# Patient Record
Sex: Female | Born: 1980
Health system: Southern US, Community
[De-identification: ages and names within clinical notes are randomized; demographics above are authoritative.]

## PROBLEM LIST (undated history)

## (undated) DIAGNOSIS — M797 Fibromyalgia: Secondary | ICD-10-CM

## (undated) DIAGNOSIS — F319 Bipolar disorder, unspecified: Secondary | ICD-10-CM

## (undated) DIAGNOSIS — I10 Essential (primary) hypertension: Secondary | ICD-10-CM

## (undated) DIAGNOSIS — F32A Depression, unspecified: Secondary | ICD-10-CM

## (undated) DIAGNOSIS — F419 Anxiety disorder, unspecified: Secondary | ICD-10-CM

## (undated) DIAGNOSIS — D497 Neoplasm of unspecified behavior of endocrine glands and other parts of nervous system: Secondary | ICD-10-CM

## (undated) DIAGNOSIS — B80 Enterobiasis: Secondary | ICD-10-CM

## (undated) DIAGNOSIS — F329 Major depressive disorder, single episode, unspecified: Secondary | ICD-10-CM

## (undated) HISTORY — PX: ECTOPIC PREGNANCY SURGERY: SHX613

## (undated) HISTORY — PX: OTHER SURGICAL HISTORY: SHX169

## (undated) HISTORY — PX: APPENDECTOMY: SHX54

## (undated) HISTORY — PX: TUBAL LIGATION: SHX77

---

## 2003-04-12 ENCOUNTER — Other Ambulatory Visit: Admission: RE | Admit: 2003-04-12 | Discharge: 2003-04-12 | Payer: Self-pay | Admitting: Obstetrics & Gynecology

## 2003-05-14 ENCOUNTER — Emergency Department (HOSPITAL_COMMUNITY): Admission: EM | Admit: 2003-05-14 | Discharge: 2003-05-14 | Payer: Self-pay | Admitting: Internal Medicine

## 2003-05-27 ENCOUNTER — Emergency Department (HOSPITAL_COMMUNITY): Admission: EM | Admit: 2003-05-27 | Discharge: 2003-05-27 | Payer: Self-pay | Admitting: Emergency Medicine

## 2004-10-23 ENCOUNTER — Emergency Department (HOSPITAL_COMMUNITY): Admission: EM | Admit: 2004-10-23 | Discharge: 2004-10-23 | Payer: Self-pay | Admitting: Emergency Medicine

## 2007-07-04 ENCOUNTER — Emergency Department (HOSPITAL_COMMUNITY): Admission: EM | Admit: 2007-07-04 | Discharge: 2007-07-04 | Payer: Self-pay | Admitting: Emergency Medicine

## 2007-07-18 ENCOUNTER — Emergency Department (HOSPITAL_COMMUNITY): Admission: EM | Admit: 2007-07-18 | Discharge: 2007-07-18 | Payer: Self-pay | Admitting: Emergency Medicine

## 2007-07-19 ENCOUNTER — Emergency Department (HOSPITAL_COMMUNITY): Admission: EM | Admit: 2007-07-19 | Discharge: 2007-07-19 | Payer: Self-pay | Admitting: Emergency Medicine

## 2007-09-30 ENCOUNTER — Inpatient Hospital Stay (HOSPITAL_COMMUNITY): Admission: AD | Admit: 2007-09-30 | Discharge: 2007-09-30 | Payer: Self-pay | Admitting: Obstetrics & Gynecology

## 2007-10-08 ENCOUNTER — Ambulatory Visit (HOSPITAL_COMMUNITY): Admission: RE | Admit: 2007-10-08 | Discharge: 2007-10-08 | Payer: Self-pay | Admitting: Obstetrics and Gynecology

## 2007-10-08 ENCOUNTER — Encounter: Payer: Self-pay | Admitting: Obstetrics and Gynecology

## 2007-11-27 ENCOUNTER — Emergency Department (HOSPITAL_COMMUNITY): Admission: EM | Admit: 2007-11-27 | Discharge: 2007-11-27 | Payer: Self-pay | Admitting: Emergency Medicine

## 2007-12-22 ENCOUNTER — Ambulatory Visit (HOSPITAL_COMMUNITY): Admission: RE | Admit: 2007-12-22 | Discharge: 2007-12-22 | Payer: Self-pay | Admitting: Urology

## 2008-02-21 ENCOUNTER — Emergency Department (HOSPITAL_COMMUNITY): Admission: EM | Admit: 2008-02-21 | Discharge: 2008-02-21 | Payer: Self-pay | Admitting: Emergency Medicine

## 2010-12-04 NOTE — H&P (Signed)
[name], [name]               ACCOUNT NO.:  [id number]   MEDICAL RECORD NO.:  [id number]          PATIENT TYPE:  AMB   LOCATION:  DAY                           FACILITY:  [facility]   PHYSICIAN:  Tilda Burrow, M.D. DATE OF BIRTH:  1981-01-08   DATE OF ADMISSION:  DATE OF DISCHARGE:  LH                              HISTORY & PHYSICAL   ADMISSION DIAGNOSIS:  Right ectopic pregnancy.   HISTORY OF PRESENT ILLNESS:  1. Unruptured, declining continued methotrexate therapy.  2. Suboptimal response to first methotrexate dose.   HISTORY OF PRESENT ILLNESS:  This 30 year old female, gravida 5, para 1,  AB 3 is admitted at this time for treatment of right ectopic pregnancy.  [name] has been followed through our office for various GYN problems for  several years.  Most recently, she was confirmed as being pregnant in  February through Dr. Talbot Grumbling at [facility] where hemoglobin was  31, hematocrit 37, with urine pregnancy test in the 400s.  She was  referred for medical care.  She returned to our office where pregnancy  levels gradually increased.  She had good progesterone levels and  quantitative HCGs increased.  She reached on March 4 she had  progesterone level 14.4 and quantitative HCG level of 4000.  Intrauterine pregnancy could not be identified on initial ultrasound,  and due to the high quantitative HCG, she was presumed to have an  ectopic pregnancy.  The patient was counseled on September 30, 2007 and  chose to receive methotrexate therapy.  At that time, a quantitative HCG  level was 13,690.  Repeat ultrasound confirmed that there was no  intrauterine pregnancy.  She received methotrexate dose per protocol at  [facility], and four days later as per protocol.  quantitative HCG was 26,494.  On day 7, today the 17th, quantitative was  29,456 and continues to rise, however, slowly.  The patient remains pain  free.  The ectopic pregnancy is clearly visualized  on ultrasound  performed at [facility] as well as our office.  The patient is  given options of repeating the methotrexate therapy, and she declines  that offer at this time.  She desires to proceed with surgical  treatment, and after counseling over the surgery, its technique,  potential difficulties including the need for removal of the entire  right tube or additional surgeries if bleeding or complications  occurred, the patient desires to proceed with laparoscopic treatment of  right ectopic.  We have told that we will make every reasonable effort  to salvage the right tube and take photos to document condition of both  tubes.  Blood type is O+.   PAST MEDICAL HISTORY:  Benign.   SURGICAL HISTORY:  C-section in 2000 in [place].  Miscarriages in 2002,  2005, 2006.   Pupils equal, round, reactive.  Extraocular movements intact.  NECK:  Supple.  CHEST:  Clear to auscultation.  ABDOMEN:  Minimal discomfort on the right.  EXTERNAL GENITALIA:  Normal.  VAGINAL EXAM:  Normal secretions.  Uterus mid position, nontender.  Adnexa not checked at this time  based on ultrasound findings.   PLAN:  Laparoscopic removal of right ectopic pregnancy on October 08, 2007.      Tilda Burrow, M.D.  Electronically Signed     JVF/MEDQ  D:  10/07/2007  T:  10/08/2007  Job:  [id number]

## 2010-12-07 NOTE — Op Note (Signed)
[name], [name]               ACCOUNT NO.:  [id number]   MEDICAL RECORD NO.:  [id number]          PATIENT TYPE:  AMB   LOCATION:  DAY                           FACILITY:  [facility]   PHYSICIAN:  Tilda Burrow, M.D. DATE OF BIRTH:  06/19/1981   DATE OF PROCEDURE:  DATE OF DISCHARGE:                               OPERATIVE REPORT   PREOPERATIVE DIAGNOSIS:  Right ectopic pregnancy.   POSTOPERATIVE DIAGNOSES:  1. Right ectopic pregnancy.  2. Intraabdominal adhesions.  3. Pelvic adhesions.   PROCEDURE PERFORMED:  Laparoscopic right salpingectomy.   SURGEON:  Tilda Burrow, M.D.   ASSISTANT:  None.   ANESTHESIA:  General.   COMPLICATIONS:  None.   FINDINGS:  Abnormal pelvic anatomy with right fallopian tube adherent to  anterior abdominal wall near the insertion of the round ligament on the  right, hugely distended ampullary portion of right tube due to ectopic  pregnancy, normal-appearing right ovary, left tube also adherent to  anterior abdominal wall in a similar position to the opposite side  though less severely distorted.   DETAILS OF PROCEDURE:  The patient was taken to the operating room and  prepped and draped for combined abdominal/vaginal procedure with Hawkins  tenaculum attached to the cervix for uterine manipulation, Foley  catheter in place.   Infraumbilical, vertical 1-cm skin incision was made and Veress needle  introduced into the umbilicus.  Water droplet test used to confirm  satisfactory intraperitoneal location and then pneumoperitoneum achieved  without difficulty using two liters CO2.  Trocar was introduced and  inspection of the pelvis revealed an area of omental adhesions to the  anterior abdominal wall at the site of a prior cesarean section scar.  A  suprapubic trocar was placed under direct visualization just inferior to  the submental attachment.  See photos 1, 2 and 3.  The omental adhesions  were taken down after a third trocar was placed  in the right lower  quadrant and harmonic scalpel used to free up the omentum.  Attention to  the pelvis was performed as noted in photo 3.  The distended right tube  was distinctly abnormal and attached to the anterior abdominal wall in a  very atypical fashion.  The right ureter could be identified as being in  the normal position, well out of surgical area.  Decision was made to  proceed with salpingectomy.   The left side was photo documented as well which showed it to be  attached to the anterior abdominal wall just lateral to the round  ligament on the left.  The tube itself and fimbria appeared grossly  normal.   Attention was then directed to the right tube and using harmonic  scalpel, the tube was transected proximally at the corner of the uterus  and distally just beneath the fimbria and the mesosalpinx taken down  with harmonic scalpel with good hemostasis.  Specimen is photo  documented in photo 5, then left tube is visualized and mobilized  partially to allow it to drape into the pelvis in a more physiologic  position right next to the  left ovary.  Re-inspection of the right  adnexa showed no additional bleeding and a photo of the right ovary is  in the chart, photo 7.  Deflation of the abdomen followed instilling 200  mL of saline.  The suprapubic and umbilical sites were closed using 0  Vicryl at the fascia and 3-0 Vicryl subcutaneously.  The right lower  quadrant incision only required subcutaneous closure.  The patient  tolerated the procedure well and went to the recovery room in stable  condition.  She received Darvocet for pain as per patient request.      Tilda Burrow, M.D.  Electronically Signed     JVF/MEDQ  D:  10/09/2007  T:  10/09/2007  Job:  [id number]   cc:   [facility]

## 2011-04-19 LAB — BASIC METABOLIC PANEL
BUN: 11
Glucose, Bld: 162 — ABNORMAL HIGH
Potassium: 3.5

## 2011-04-19 LAB — DIFFERENTIAL
Eosinophils Absolute: 0.1
Eosinophils Relative: 1
Neutro Abs: 7.5
Neutrophils Relative %: 77

## 2011-04-19 LAB — CBC
Hemoglobin: 13.5
MCV: 93.4
Platelets: 267
RBC: 4.23
RDW: 12.2

## 2011-04-19 LAB — URINALYSIS, ROUTINE W REFLEX MICROSCOPIC
Bilirubin Urine: NEGATIVE
Glucose, UA: NEGATIVE
Leukocytes, UA: NEGATIVE
Nitrite: NEGATIVE
pH: 7

## 2011-04-19 LAB — PREGNANCY, URINE: Preg Test, Ur: NEGATIVE

## 2011-04-19 LAB — URINE MICROSCOPIC-ADD ON

## 2011-04-26 LAB — URINE CULTURE: Colony Count: >=100000

## 2011-04-26 LAB — URINE MICROSCOPIC-ADD ON

## 2011-04-26 LAB — URINALYSIS, ROUTINE W REFLEX MICROSCOPIC
Protein, ur: 100 — AB
pH: 5.5

## 2011-04-29 LAB — URINE MICROSCOPIC-ADD ON

## 2011-04-29 LAB — URINALYSIS, ROUTINE W REFLEX MICROSCOPIC
Ketones, ur: NEGATIVE
Nitrite: NEGATIVE

## 2011-04-29 LAB — PREGNANCY, URINE: Preg Test, Ur: NEGATIVE

## 2012-02-14 ENCOUNTER — Encounter (HOSPITAL_COMMUNITY): Payer: Self-pay | Admitting: Emergency Medicine

## 2012-02-14 ENCOUNTER — Emergency Department (HOSPITAL_COMMUNITY)
Admission: EM | Admit: 2012-02-14 | Discharge: 2012-02-14 | Disposition: A | Discharge status: Home / Self Care | Payer: Medicaid Other | Attending: Emergency Medicine | Admitting: Emergency Medicine

## 2012-02-14 DIAGNOSIS — R600 Localized edema: Secondary | ICD-10-CM

## 2012-02-14 DIAGNOSIS — IMO0002 Reserved for concepts with insufficient information to code with codable children: Secondary | ICD-10-CM

## 2012-02-14 DIAGNOSIS — L03039 Cellulitis of unspecified toe: Secondary | ICD-10-CM | POA: Insufficient documentation

## 2012-02-14 DIAGNOSIS — Z9089 Acquired absence of other organs: Secondary | ICD-10-CM | POA: Insufficient documentation

## 2012-02-14 MED ORDER — ACETAMINOPHEN 325 MG PO TABS
650.0000 mg | ORAL_TABLET | Freq: Once | ORAL | Status: AC
  Administered 2012-02-14: 650 mg via ORAL
  Filled 2012-02-14: qty 2

## 2012-02-14 MED ORDER — CEPHALEXIN 500 MG PO CAPS: 500.0000 mg | ORAL_CAPSULE | Freq: Four times a day (QID) | ORAL | Status: AC

## 2012-02-14 MED ORDER — CEPHALEXIN 500 MG PO CAPS
500.0000 mg | ORAL_CAPSULE | Freq: Once | ORAL | Status: AC
  Administered 2012-02-14: 500 mg via ORAL
  Filled 2012-02-14: qty 1

## 2012-02-14 NOTE — ED Notes (Addendum)
Pt states she has had some leg and foot swelling x1 week. States the pain and swelling is happening bilaterally. Also notes some bleeding from her R great toe starting yesterday. Denies injury to the toe. Right foot appears more swollen than left. Notes starting a new medication suboxone for 2 months.

## 2012-02-14 NOTE — ED Provider Notes (Signed)
History     CSN: [id number]  Arrival date & time 02/14/12  0247   First MD Initiated Contact with Patient 02/14/12 0310      Chief Complaint  Patient presents with  . Foot Swelling    (Consider location/radiation/quality/duration/timing/severity/associated sxs/prior treatment) HPI [name] is a 31 y.o. female who presents to the Emergency Department complaining of bilateral foot swelling for two days. She works on her feet, standing on hard floors. She noticed over the last two days, swelling to both feet when she gets home in the morning. Now with discomfort to  the feet that extends up to her knees. Denies numbness, tingling, extremity weakness.  PCP Dr. Felecia Shelling    History reviewed. No pertinent past medical history.  Past Surgical History  Procedure Date  . Cesarean section     x2  . Appendectomy     History reviewed. No pertinent family history.  History  Substance Use Topics  . Smoking status: Never Smoker   . Smokeless tobacco: Not on file  . Alcohol Use: No    OB History    Grav Para Term Preterm Abortions TAB SAB Ect Mult Living                  Review of Systems  Constitutional: Negative for fever.       10 Systems reviewed and are negative for acute change except as noted in the HPI.  HENT: Negative for congestion.   Eyes: Negative for discharge and redness.  Respiratory: Negative for cough and shortness of breath.   Cardiovascular: Negative for chest pain.  Gastrointestinal: Negative for vomiting and abdominal pain.  Musculoskeletal: Negative for back pain.       Foot swelling  Skin: Negative for rash.  Neurological: Negative for syncope, numbness and headaches.  Psychiatric/Behavioral:       No behavior change.    Allergies  Review of patient's allergies indicates no known allergies.  Home Medications   Current Outpatient Rx  Name Route Sig Dispense Refill  . ALPRAZOLAM 1 MG PO TABS Oral Take 1 mg by mouth at bedtime as needed.     . AMPHETAMINE-DEXTROAMPHETAMINE 20 MG PO TABS Oral Take 20 mg by mouth 2 (two) times daily.    Marland Kitchen BUPRENORPHINE HCL-NALOXONE HCL 8-2 MG SL SUBL Sublingual Place under the tongue 2 (two) times daily.    . CEPHALEXIN 500 MG PO CAPS Oral Take 1 capsule (500 mg total) by mouth 4 (four) times daily. 40 capsule 0    BP 123/72  Pulse 118  Temp 98.4 F (36.9 C)  Resp 18  Ht 5\' 2"  (1.575 m)  Wt 125 lb (56.7 kg)  BMI 22.86 kg/m2  SpO2 99%  LMP 01/21/2012  Physical Exam  Nursing note and vitals reviewed. Constitutional: She is oriented to person, place, and time. She appears well-developed and well-nourished.       Awake, alert, nontoxic appearance.  HENT:  Head: Atraumatic.  Right Ear: External ear normal.  Left Ear: External ear normal.  Eyes: Conjunctivae and EOM are normal. Pupils are equal, round, and reactive to light. Right eye exhibits no discharge. Left eye exhibits no discharge.  Neck: Normal range of motion. Neck supple.  Cardiovascular: Normal rate, normal heart sounds and intact distal pulses.   Pulmonary/Chest: Effort normal and breath sounds normal. She exhibits no tenderness.  Abdominal: Soft. There is no tenderness. There is no rebound.  Musculoskeletal: She exhibits no tenderness.  Baseline ROM, no obvious new focal weakness. Trace edema to dorsum of feet. Right great toe with paronychia.  Neurological: She is alert and oriented to person, place, and time.       Mental status and motor strength appears baseline for patient and situation.  Skin: No rash noted.  Psychiatric: She has a normal mood and affect.    ED Course  Procedures (including critical care time)      MDM  Patient with bilateral foot swelling. Also has a paronychia on the right great toe. Given antibiotics and  Tylenol. Patient did not want toe opened and drained. Recommended I&D and alternative with soaks and antibiotics. Patient knows to return if toe worsens. Pt stable in ED with no  significant deterioration in condition.The patient appears reasonably screened and/or stabilized for discharge and I doubt any other medical condition or other [facility] requiring further screening, evaluation, or treatment in the ED at this time prior to discharge.  MDM Reviewed: previous chart, nursing note and vitals Interpretation: labs and ECG          Nicoletta Dress. Colon Branch, MD 02/14/12 [phone]

## 2012-03-08 ENCOUNTER — Emergency Department (HOSPITAL_COMMUNITY)
Admission: EM | Admit: 2012-03-08 | Discharge: 2012-03-10 | Disposition: A | Discharge status: Home / Self Care | Payer: Medicaid Other | Attending: Emergency Medicine | Admitting: Emergency Medicine

## 2012-03-08 ENCOUNTER — Encounter (HOSPITAL_COMMUNITY): Payer: Self-pay | Admitting: Emergency Medicine

## 2012-03-08 DIAGNOSIS — F19959 Other psychoactive substance use, unspecified with psychoactive substance-induced psychotic disorder, unspecified: Secondary | ICD-10-CM

## 2012-03-08 DIAGNOSIS — R21 Rash and other nonspecific skin eruption: Secondary | ICD-10-CM | POA: Insufficient documentation

## 2012-03-08 DIAGNOSIS — Z9089 Acquired absence of other organs: Secondary | ICD-10-CM | POA: Insufficient documentation

## 2012-03-08 DIAGNOSIS — F1999 Other psychoactive substance use, unspecified with unspecified psychoactive substance-induced disorder: Secondary | ICD-10-CM | POA: Insufficient documentation

## 2012-03-08 LAB — ETHANOL: Alcohol, Ethyl (B): <11 mg/dL (ref 0–11)

## 2012-03-08 LAB — URINALYSIS, ROUTINE W REFLEX MICROSCOPIC
Leukocytes, UA: NEGATIVE
Nitrite: NEGATIVE
Specific Gravity, Urine: >1.03 — ABNORMAL HIGH (ref 1.005–1.030)
pH: 5.5 (ref 5.0–8.0)

## 2012-03-08 LAB — POCT I-STAT, CHEM 8
BUN: 18 mg/dL (ref 6–23)
Creatinine, Ser: 1 mg/dL (ref 0.50–1.10)
Potassium: 2.9 mEq/L — ABNORMAL LOW (ref 3.5–5.1)
Sodium: 140 mEq/L (ref 135–145)
TCO2: 28 mmol/L (ref 0–100)

## 2012-03-08 LAB — CBC
Hemoglobin: 13.2 g/dL (ref 12.0–15.0)
MCHC: 34.8 g/dL (ref 30.0–36.0)

## 2012-03-08 LAB — POTASSIUM: Potassium: 4 mEq/L (ref 3.5–5.1)

## 2012-03-08 LAB — RAPID URINE DRUG SCREEN, HOSP PERFORMED: Benzodiazepines: POSITIVE — AB

## 2012-03-08 MED ORDER — LORAZEPAM 1 MG PO TABS: 2.0000 mg | ORAL_TABLET | Freq: Two times a day (BID) | ORAL | Status: DC | PRN

## 2012-03-08 MED ORDER — ZIPRASIDONE HCL 20 MG PO CAPS
20.0000 mg | ORAL_CAPSULE | Freq: Two times a day (BID) | ORAL | Status: DC
  Administered 2012-03-08: 20 mg via ORAL
  Filled 2012-03-08 (×5): qty 1

## 2012-03-08 MED ORDER — POTASSIUM CHLORIDE CRYS ER 20 MEQ PO TBCR
40.0000 meq | EXTENDED_RELEASE_TABLET | Freq: Two times a day (BID) | ORAL | Status: DC
  Administered 2012-03-08 – 2012-03-10 (×4): 40 meq via ORAL
  Filled 2012-03-08 (×3): qty 2

## 2012-03-08 MED ORDER — POTASSIUM CHLORIDE CRYS ER 20 MEQ PO TBCR
40.0000 meq | EXTENDED_RELEASE_TABLET | Freq: Once | ORAL | Status: AC
  Administered 2012-03-08: 40 meq via ORAL
  Filled 2012-03-08: qty 2

## 2012-03-08 MED ORDER — CLONIDINE HCL 0.1 MG PO TABS: 0.1000 mg | ORAL_TABLET | Freq: Three times a day (TID) | ORAL | Status: DC | PRN

## 2012-03-08 NOTE — ED Notes (Signed)
Pt reporting generalized rash, and "feeling like there are pins in my arms, and something trying to come out of my skin."  Isolated small red, scabbed areas noted on pt's arms and left cheek.  Pt reporting scratching areas due to itching.  No distress noted at this time.

## 2012-03-08 NOTE — ED Notes (Signed)
Patient c/o rash and feeling like she's being bitten and that she has pins coming out of her arms.

## 2012-03-08 NOTE — ED Provider Notes (Cosign Needed)
Pt has been sleeping most of day.  When awakened she still feels like she has bugs on her legs. Has some faint redness of her lower legs c/w scratching. Otherwise she has no complaints and is very cooperative.    10:22 Samara Deist ACT states she was aware of patient but was told there was no rush to see patient.   12:45 Samara Deist ACT has seen patient and submitted papers to [facility], waiting for psychosis bed.   Ward Givens, MD 03/08/12 1246

## 2012-03-08 NOTE — ED Notes (Signed)
Rodney Booze (sister)  [phone] Ceasar Mons (aunt) [phone] or [phone] Rhunette Croft (grandmother) [phone]

## 2012-03-08 NOTE — ED Provider Notes (Signed)
History     CSN: [id number]  Arrival date & time 03/08/12  0145   First MD Initiated Contact with Patient 03/08/12 [phone]      Chief Complaint  Patient presents with  . Rash    (Consider location/radiation/quality/duration/timing/severity/associated sxs/prior treatment) HPI HX provided by PT - believes there are bugs everywhere and they are biting her and her 22 month old child.  She has ben bathing herself and her child repeatedly without any change.  She has had multiple clinic and ED visits for this and treated for scabies, pinworm, skin infections without any relief of symptoms. No F/C, no N/V, no recent travel or known exposures.   Further history provided by sister and aunt who accompany her to the ED. Has previous h/o drug use "anything and everything" - recently evaluated by a PCP who prescribed suboxone, adderall and xanax.  Symptoms seem to be related to starting these medications.  Family are concerned for well-being of her child as she has been seen scrubbing the child with alcohol and giving multiple baths and constantly picking at her skin and her child with perseverating over bugs that no one else can see. Sister is comfortable caring for child if PT requires admission.   History reviewed. No pertinent past medical history.  Past Surgical History  Procedure Date  . Cesarean section     x2  . Appendectomy     No family history on file.  History  Substance Use Topics  . Smoking status: Never Smoker   . Smokeless tobacco: Not on file  . Alcohol Use: No    OB History    Grav Para Term Preterm Abortions TAB SAB Ect Mult Living                  Review of Systems  Constitutional: Negative for fever and chills.  HENT: Negative for neck pain and neck stiffness.   Eyes: Negative for pain.  Respiratory: Negative for shortness of breath.   Cardiovascular: Negative for chest pain.  Gastrointestinal: Negative for abdominal pain.  Genitourinary: Negative for dysuria.    Musculoskeletal: Negative for back pain.  Neurological: Negative for headaches.  All other systems reviewed and are negative.    Allergies  Review of patient's allergies indicates no known allergies.  Home Medications   Current Outpatient Rx  Name Route Sig Dispense Refill  . ALPRAZOLAM 1 MG PO TABS Oral Take 1 mg by mouth at bedtime as needed.    . AMPHETAMINE-DEXTROAMPHETAMINE 20 MG PO TABS Oral Take 20 mg by mouth 2 (two) times daily.    Marland Kitchen BUPRENORPHINE HCL-NALOXONE HCL 8-2 MG SL SUBL Sublingual Place under the tongue 2 (two) times daily.      BP 128/89  Pulse 115  Temp 98.4 F (36.9 C) (Oral)  Resp 18  SpO2 98%  LMP 01/21/2012  Physical Exam  Constitutional: She is oriented to person, place, and time. She appears well-developed and well-nourished.  HENT:  Head: Normocephalic and atraumatic.  Eyes: Conjunctivae and EOM are normal. Pupils are equal, round, and reactive to light.  Neck: Trachea normal. Neck supple. No thyromegaly present.  Cardiovascular: Normal rate, regular rhythm, S1 normal, S2 normal and normal pulses.     No systolic murmur is present   No diastolic murmur is present  Pulses:      Radial pulses are 2+ on the right side, and 2+ on the left side.  Pulmonary/Chest: Effort normal and breath sounds normal. She has no wheezes. She  has no rhonchi. She has no rales. She exhibits no tenderness.  Abdominal: Soft. Normal appearance and bowel sounds are normal. There is no tenderness. There is no CVA tenderness and negative Murphy's sign.  Musculoskeletal:       BLE:s Calves nontender, no cords or erythema, negative Homans sign  Neurological: She is alert and oriented to person, place, and time. She has normal strength. No cranial nerve deficit or sensory deficit. GCS eye subscore is 4. GCS verbal subscore is 5. GCS motor subscore is 6.  Skin: Skin is warm and dry. She is not diaphoretic.       No sig lesions or wounds.  There are few small areas of erythema  but no cellulitis, abscess or sig excoriations  Psychiatric: Her speech is normal.       Psychosis present    ED Course  Procedures (including critical care time)  Results for orders placed during the hospital encounter of 03/08/12  CBC      Component Value Range   WBC 4.6  4.0 - 10.5 K/uL   RBC 4.38  3.87 - 5.11 MIL/uL   Hemoglobin 13.2  12.0 - 15.0 g/dL   HCT 96.0  45.4 - 09.8 %   MCV 86.5  78.0 - 100.0 fL   MCH 30.1  26.0 - 34.0 pg   MCHC 34.8  30.0 - 36.0 g/dL   RDW 11.9  14.7 - 82.9 %   Platelets 217  150 - 400 K/uL  ETHANOL      Component Value Range   Alcohol, Ethyl (B) <11  0 - 11 mg/dL  URINALYSIS, ROUTINE W REFLEX MICROSCOPIC      Component Value Range   Color, Urine YELLOW  YELLOW   APPearance CLOUDY (*) CLEAR   Specific Gravity, Urine >1.030 (*) 1.005 - 1.030   pH 5.5  5.0 - 8.0   Glucose, UA NEGATIVE  NEGATIVE mg/dL   Hgb urine dipstick NEGATIVE  NEGATIVE   Bilirubin Urine SMALL (*) NEGATIVE   Ketones, ur TRACE (*) NEGATIVE mg/dL   Protein, ur NEGATIVE  NEGATIVE mg/dL   Urobilinogen, UA 1.0  0.0 - 1.0 mg/dL   Nitrite NEGATIVE  NEGATIVE   Leukocytes, UA NEGATIVE  NEGATIVE  URINE RAPID DRUG SCREEN (HOSP PERFORMED)      Component Value Range   Opiates NONE DETECTED  NONE DETECTED   Cocaine NONE DETECTED  NONE DETECTED   Benzodiazepines POSITIVE (*) NONE DETECTED   Amphetamines POSITIVE (*) NONE DETECTED   Tetrahydrocannabinol NONE DETECTED  NONE DETECTED   Barbiturates NONE DETECTED  NONE DETECTED  POCT I-STAT, CHEM 8      Component Value Range   Sodium 140  135 - 145 mEq/L   Potassium 2.9 (*) 3.5 - 5.1 mEq/L   Chloride 101  96 - 112 mEq/L   BUN 18  6 - 23 mg/dL   Creatinine, Ser 5.62  0.50 - 1.10 mg/dL   Glucose, Bld 91  70 - 99 mg/dL   Calcium, Ion 1.30  8.65 - 1.23 mmol/L   TCO2 28  0 - 100 mmol/L   Hemoglobin 13.6  12.0 - 15.0 g/dL   HCT 78.4  69.6 - 29.5 %   6:19 AM telepsych consult obtained, Dr Trisha Mangle recommends inpatient PSY admit, is NOT  psychaitricaly stable, stop all current meds and start ativan 2mg  BID, Geodon 20mg  BID, Clonidine 0.1mg  PO TID prn opiod withdrawal symptoms. No SI/ HI.  Social worker consult to notify CPS, sister and  aunt currently have 95 mo old child.    ACT notified and will help arrange PSY admit. IVC  6:43 AM d/w [facility] - no 400 beds available.   ED holding orders and plan PSY admit.  MDM   nurisng notes and VS reviewed. Labs as above. PSY consult and PSY dispo plan admit        Sunnie Nielsen, MD 03/08/12 [phone]

## 2012-03-08 NOTE — ED Notes (Signed)
Notified Dr. Lynelle Doctor that ACT member related [facility] will not accept patient until K+ is corrected to at least 3.5.  Called Samara Deist w/ACT to find out if [facility] actually has a bed available now, which will determine if K+ is treated IV or PO.

## 2012-03-08 NOTE — ED Notes (Signed)
Social work called, on the way to see pt.

## 2012-03-08 NOTE — ED Notes (Signed)
Telepsych consult begun.

## 2012-03-08 NOTE — ED Notes (Addendum)
Pt observed repeatedly wiping down room.  When asked, she states she just" wanted to make sure it's clean."  Pt presently waiting for telepsych consult. However, she is third on the list for consult.

## 2012-03-08 NOTE — BH Assessment (Signed)
Assessment Note   [name] is an 31 y.o. female. PT WAS BROUGHT IN BY FAMILY DUE TO BIZARRE BEHAVIOR & POSSIBLE DELUSIONAL SYMPTOMS. PT BELIEVES BUGS & THINGS ARE CRAWLING OVER HER & BABY. PT HAS BEEN GIVING HER 13 MONTH OLD BABY SEVERAL BATH IN A DAY INCLUDING RUBBING HER DOWN WITH ALCOHOL WIPES WHICH HAS CAUSE RASHES ON THE BABY. PT SAYS SHE SEES THIS THINGS ON HER LEG & BODY EVEN THOUGH THERE IS NOTHINGS VISIBLY CRAWLING ON PT. PT DENIES ANY IDEATION BUT ADMITS TO A PAST HX OF PAIN PILL ABUSE WHICH SHE IS ONE SUBOXEN FOR. PT DENIES ANY CURRENT DRUG USE. PT HAS BEEN TELEPSYCHED & THEY RECOMMENDED INPT TX. PT HAS BEEN REFERRED TO [facility].  Axis I: DELUSIONAL DISORDER Axis II: Deferred Axis III: History reviewed. No pertinent past medical history. Axis IV: other psychosocial or environmental problems Axis V: 21-30 behavior considerably influenced by delusions or hallucinations OR serious impairment in judgment, communication OR inability to function in almost all areas  Past Medical History: History reviewed. No pertinent past medical history.  Past Surgical History  Procedure Date  . Cesarean section     x2  . Appendectomy     Family History: No family history on file.  Social History:  reports that she has never smoked. She does not have any smokeless tobacco history on file. She reports that she does not drink alcohol or use illicit drugs.  Additional Social History:     CIWA: CIWA-Ar BP: 98/59 mmHg Pulse Rate: 82  COWS:    Allergies: No Known Allergies  Home Medications:  (Not in a hospital admission)  OB/GYN Status:  Patient's last menstrual period was 01/21/2012.  General Assessment Data Location of Assessment: [facility] ACT Assessment: Yes Living Arrangements: Parent;Other relatives;Children Can pt return to current living arrangement?: Yes Admission Status: Involuntary Is patient capable of signing voluntary admission?: Yes Transfer from:  Acute Hospital Referral Source: MD     Risk to self Suicidal Ideation: No Suicidal Intent: No Is patient at risk for suicide?: No Suicidal Plan?: No Access to Means: No What has been your use of drugs/alcohol within the last 12 months?: PT HAS A HX OF ABUSING PAIN PILLS Previous Attempts/Gestures: No How many times?: 0  Other Self Harm Risks: HX OF SELF MEDICATING Triggers for Past Attempts: Unpredictable Intentional Self Injurious Behavior: None Family Suicide History: Yes Recent stressful life event(s): Turmoil (Comment) Persecutory voices/beliefs?: Yes Depression: No Depression Symptoms: Loss of interest in usual pleasures Substance abuse history and/or treatment for substance abuse?: Yes Suicide prevention information given to non-admitted patients: Not applicable  Risk to Others Homicidal Ideation: No Thoughts of Harm to Others: No Current Homicidal Intent: No Current Homicidal Plan: No Access to Homicidal Means: No Identified Victim: NA History of harm to others?: No Assessment of Violence: None Noted Violent Behavior Description: CALM, COOPERATIVE Does patient have access to weapons?: No Criminal Charges Pending?: No Does patient have a court date: No  Psychosis Hallucinations: None noted Delusions: None noted  Mental Status Report Appear/Hygiene: Disheveled Eye Contact: Fair Motor Activity: Freedom of movement Speech: Logical/coherent Level of Consciousness: Alert Mood: Anhedonia;Suspicious Affect: Appropriate to circumstance Anxiety Level: None Thought Processes: Coherent;Relevant Judgement: Unimpaired Orientation: Person;Place;Time;Situation Obsessive Compulsive Thoughts/Behaviors: None  Cognitive Functioning Concentration: Decreased Memory: Recent Intact;Remote Intact IQ: Average Insight: Poor Impulse Control: Poor Appetite: Fair Weight Loss: 0  Weight Gain: 0  Sleep: Decreased Total Hours of Sleep: 0  Vegetative Symptoms:  None  ADLScreening [facility]) Patient's cognitive ability adequate to safely complete daily activities?: Yes Patient able to express need for assistance with ADLs?: Yes Independently performs ADLs?: Yes (appropriate for developmental age)  Abuse/Neglect [facility]) Physical Abuse: Denies Verbal Abuse: Denies Sexual Abuse: Denies  Prior Inpatient Therapy Prior Inpatient Therapy: No Prior Therapy Dates: NA Prior Therapy Facilty/Provider(s): NA Reason for Treatment: NA  Prior Outpatient Therapy Prior Outpatient Therapy: Yes Prior Therapy Dates: CURRENT Prior Therapy Facilty/Provider(s): DR. [facility] Reason for Treatment: MED MANAGEMENT  ADL Screening (condition at time of admission) Patient's cognitive ability adequate to safely complete daily activities?: Yes Patient able to express need for assistance with ADLs?: Yes Independently performs ADLs?: Yes (appropriate for developmental age)       Abuse/Neglect Assessment (Assessment to be complete while patient is alone) Physical Abuse: Denies Verbal Abuse: Denies Sexual Abuse: Denies Values / Beliefs Cultural Requests During Hospitalization: None Spiritual Requests During Hospitalization: None        Additional Information 1:1 In Past 12 Months?: No CIRT Risk: No Elopement Risk: No Does patient have medical clearance?: Yes     Disposition:  Disposition Disposition of Patient: Inpatient treatment program;Referred to ([facility]) Type of inpatient treatment program: Adult  On Site Evaluation by:   Reviewed with Physician:     Waldron Session 03/08/2012 11:28 AM

## 2012-03-08 NOTE — ED Notes (Signed)
Dr. Dierdre Highman to discuss recommendations of psychiatrist with pt.  It is recommended that pt be admitted as inpatient psychiatric pt.  Pt is still voluntary at this time, however may be under IVC if necessary.  Dr Dierdre Highman also recommends that Child Protective Services be notified that while pt's child is safe at this time and staying with family so no immediate concern for safety is present, his mother is not in stable enough condition to adequately care for him.  Social work consult ordered to inform CPS.

## 2012-03-08 NOTE — ED Notes (Signed)
[place] CPS report made.  CPS will follow up on 8/19, since pt is in ED and up for tx to a [facility] facility.  CPS also informed that, per pt, her child is in physical custody of her sister, Monico Hoar who can be reached at [phone].

## 2012-03-08 NOTE — ED Notes (Signed)
Telespych consult completed.

## 2012-03-09 MED ORDER — RISPERIDONE 0.5 MG PO TABS
0.5000 mg | ORAL_TABLET | Freq: Two times a day (BID) | ORAL | Status: DC
  Administered 2012-03-09 – 2012-03-10 (×3): 0.5 mg via ORAL
  Filled 2012-03-09 (×7): qty 1

## 2012-03-09 MED ORDER — LORAZEPAM 1 MG PO TABS
1.0000 mg | ORAL_TABLET | Freq: Once | ORAL | Status: AC
  Administered 2012-03-09: 1 mg via ORAL
  Filled 2012-03-09: qty 1

## 2012-03-09 NOTE — ED Notes (Signed)
Patient's belongings put in safekeeping in EMS room.  Patient was given crackers, peanut butter, and Coke.

## 2012-03-09 NOTE — ED Notes (Signed)
Pt needed note faxed to courthouse proving she is here. Sent to number provided. Pt aware. Pt remains cooperative.

## 2012-03-09 NOTE — BHH Counselor (Signed)
Received  A call from Fannie Knee in Assessments at [facility]. She informed Clinical research associate that because the patient is followed for Suboxone she cannot be admitted to [facility]. Writer contacted [facility] in order to refer the patient. No answer was received, left message for HR staff to contact this Clinical research associate. Also called [facility] to check status of the referral, left message with Joni Reining, she is to return our call.

## 2012-03-09 NOTE — ED Notes (Signed)
Pt back from shower. Nad. Comb given also. Inpatient bed given for better comfort.

## 2012-03-09 NOTE — ED Provider Notes (Signed)
This chart was scribed for Devoria Albe, MD by Charolett Bumpers . The patient was seen in room APA14/APA14.   Tommy, ACT has seen patient and thought she could be discharged and see her psychiatrist in 2 days.   12:14-Recheck: Pt reports that she no longer feels bugs crawling on her. Pt reports that she lives at home with mother-in-law and reports some bug bites on her son. Pt still seems to believe they had bug bites on herself and her sone.  Pt is up walking around room and reports she is feeling better. Redness on legs has improved. Pt is awaiting telepsych.  Nurses report she c/o being sleepy on her medications (geodon and ativan) and she wasn't given it last night.   Nurses report sister brought baby to visit patient and he had abrasions on his face from the patient scrubbing him.   Telepsych consult has been done by dr Leretha Pol. We had discussed her sleepiness on the current medications and her persistant delusions about the bugs. She recommends stopping the geodon, decrease the ativan to 0.5 mg BID and start risperidone 0.5 mg BID. She feels she still needs inpatient psychiatric admission. She recommends reporting to CPS which was done yesterday.   I personally performed the services described in this documentation, which was scribed in my presence. The recorded information has been reviewed and considered.  Devoria Albe, MD, Armando Gang   Ward Givens, MD 03/09/12 [phone]

## 2012-03-09 NOTE — BHH Counselor (Signed)
Spoke with Albin Felling in assessments at [facility]. She informed us that there would be no beds today. She asked that we call at 9:00 am tomorrow to check on discharges. Also called [facility]. Informed that they were just going through referrals and that someone would contact the hospital later. Patient will remain in the ED overnight unlesss she is accepted to [facility]. Patient status given to Dr Adriana Simas.

## 2012-03-09 NOTE — ED Notes (Signed)
Patient given crackers and a soda for a snack

## 2012-03-09 NOTE — ED Notes (Signed)
Pt visiting with sister and patient son at this time. Waiting on Tele pysh re-evaluation

## 2012-03-09 NOTE — ED Notes (Signed)
Patient with IVC paperwork since yesterday. Patient has not had sitter sitting with her. Notified charge nurse, Steward Drone, RN. Sitter now placed with patient at this time.

## 2012-03-09 NOTE — ED Notes (Signed)
This shift pt has not had any abnormal psychiatric behaviors; No itching report nor scratching observed; Pt has been calm and cooperative.

## 2012-03-09 NOTE — ED Notes (Signed)
Tele psychiatrist re-evaluated and still recommends in patient hospitalization for patient.

## 2012-03-09 NOTE — BHH Counselor (Signed)
Talked with the patient in her room. She denies any bugs etc. Crawling on her or her child today. She says she has only had these thoughts when she is alone at the home she is living in. She does not like being there. She and her son are living in the basement of the home and she feels very closed in. She has been seeing Carroll Sage MD.  For her medications . She is who started her on the Suboxone. Patient feels that her medications are not beneficial at this time. She has an appointment to see her psychiatrist on 03/11/12. She plans to keep this appointment and discuss her medications at that time. Talked with Dr Devoria Albe about the positive changes in the patient. Dr Lynelle Doctor will order another tele-psych to evaluate the patient  To see if inpatient is still recommended or if she can be seen outpatient.

## 2012-03-09 NOTE — Clinical Social Work Note (Signed)
Pt to be evaluated by CPS today. CSW left voicemail for Stanton Kidney at CPS to please notify this CSW if follow up is needed while pt is in ED. Child is in care of pt's sister. Plan is for pt to transfer to inpatient treatment.    Derenda Fennel, [place] [phone]

## 2012-03-10 MED ORDER — POTASSIUM CHLORIDE CRYS ER 20 MEQ PO TBCR
EXTENDED_RELEASE_TABLET | ORAL | Status: AC
  Filled 2012-03-10: qty 2

## 2012-03-10 NOTE — ED Notes (Signed)
[name] from act to see pt.

## 2012-03-10 NOTE — ED Notes (Signed)
Pt resting in bed, sitter at bedside

## 2012-03-10 NOTE — ED Provider Notes (Addendum)
[name] has seen patient again and feels that she is appropriate for discharge.  Psychiatry saw her yesterday and disagreed. Patient has no hallucinations, SI, HI.   Psychiatry consult performed again.  Calm and cooperative, no delusional thought process. IVC papers rescinded.    CPS has been involved.  Read Drivers and [place] LSW have cleared patient to go home with sister per [place].  BP 93/53  Pulse 68  Temp 97.7 F (36.5 C) (Oral)  Resp 18  SpO2 100%  LMP 01/21/2012   Glynn Octave, MD 03/10/12 1345  Glynn Octave, MD 03/10/12 [phone]

## 2012-03-10 NOTE — ED Notes (Signed)
Report given to T. Aubery Lapping, RN

## 2012-03-10 NOTE — ED Notes (Signed)
Patient placed in paper scrubs and belongings secured in ems closet. Patient verbalized understanding as to why we needed to place her in scrubs and secure her belongings. Patient compliant and cooperative.

## 2012-03-10 NOTE — ED Notes (Signed)
2 belongings bags returned to pt. Sister pick pt up.

## 2012-03-10 NOTE — ED Notes (Signed)
Pt requested staff fax info. To court house/attourney, stated that she missed her court date on Monday. Advised that was are unable to fax any info. Pt verbalized understanding.

## 2012-03-10 NOTE — ED Notes (Signed)
Pt completed tele-psych consultation, plan to discharge pt to home, dr.rancour notified, pt's belongings returned.

## 2012-03-10 NOTE — BH Assessment (Signed)
Assessment Note   [name] is an 31 y.o. female. Patient came to the Ed with thoughts that bugs were on her and her son. She had been bathing herself and her son multiple times daily. Patient was seen for tele-psych twice. It was felt that this was the result of her dosage of Adderal. Patient is now aware that she was not being bitten by bugs. She understands that this was not real. She was interviewed by CPS and will be followed by them. Patient is neither suicidal nor homicidal. She is no longer delusional    nor hallucinated. Patient no longer meets criteria for inpatient care.     Axis I: Mood DO NOS; Delusional DO(clearing-may be result of Adderal) History of Opoid Abuse Axis II: Deferred Axis III: History reviewed. No pertinent past medical history. Axis IV: economic problems, housing problems, other psychosocial or environmental problems, problems with access to health care services and problems with primary support group Axis V: 51-60 moderate symptoms  Past Medical History: History reviewed. No pertinent past medical history.  Past Surgical History  Procedure Date  . Cesarean section     x2  . Appendectomy     Family History: No family history on file.  Social History:  reports that she has never smoked. She does not have any smokeless tobacco history on file. She reports that she does not drink alcohol or use illicit drugs.  Additional Social History:     CIWA: CIWA-Ar BP: 93/53 mmHg Pulse Rate: 68  COWS:    Allergies: No Known Allergies  Home Medications:  (Not in a hospital admission)  OB/GYN Status:  Patient's last menstrual period was 01/21/2012.  General Assessment Data Location of Assessment: [facility] ACT Assessment: Yes Living Arrangements:  (moving in with sister today) Can pt return to current living arrangement?: Yes Admission Status: Voluntary Is patient capable of signing voluntary admission?: Yes Transfer from: Acute Hospital Referral Source:  MD  Education Status Is patient currently in school?: No  Risk to self Suicidal Ideation: No Suicidal Intent: No Is patient at risk for suicide?: No Suicidal Plan?: No Access to Means: No What has been your use of drugs/alcohol within the last 12 months?: hx of abusing pain pills Previous Attempts/Gestures: No How many times?: 0  Other Self Harm Risks: HX OF SELF MEDICATING Triggers for Past Attempts: None known Intentional Self Injurious Behavior: None Family Suicide History: Yes Recent stressful life event(s): Turmoil (Comment) Persecutory voices/beliefs?: No Depression: No Depression Symptoms: Loss of interest in usual pleasures Substance abuse history and/or treatment for substance abuse?: No Suicide prevention information given to non-admitted patients: Not applicable  Risk to Others Homicidal Ideation: No Thoughts of Harm to Others: No Current Homicidal Intent: No Current Homicidal Plan: No Access to Homicidal Means: No Identified Victim: NA History of harm to others?: No Assessment of Violence: None Noted Violent Behavior Description: CALM, COOPERATIVE Does patient have access to weapons?: No Criminal Charges Pending?: No Does patient have a court date: No  Psychosis Hallucinations: Tactile;Visual (was seeing and feeling bugs on her -cleared at this time) Delusions: None noted  Mental Status Report Appear/Hygiene: Improved Eye Contact: Good Motor Activity: Freedom of movement Speech: Logical/coherent Level of Consciousness: Alert Mood: Depressed Affect: Appropriate to circumstance Anxiety Level: Minimal Thought Processes: Coherent;Relevant Judgement: Unimpaired Orientation: Person;Place;Time;Situation Obsessive Compulsive Thoughts/Behaviors: Minimal  Cognitive Functioning Concentration: Normal Memory: Recent Intact;Remote Intact IQ: Average Insight: Good Impulse Control: Good Appetite: Good Weight Loss: 0  Weight Gain: 0  Sleep:  Increased Total Hours of Sleep: 6  Vegetative Symptoms: None  ADLScreening [facility] Assessment Services) Patient's cognitive ability adequate to safely complete daily activities?: Yes Patient able to express need for assistance with ADLs?: Yes Independently performs ADLs?: Yes (appropriate for developmental age)  Abuse/Neglect [facility]) Physical Abuse: Denies Verbal Abuse: Denies Sexual Abuse: Denies  Prior Inpatient Therapy Prior Inpatient Therapy: No Prior Therapy Dates: NA Prior Therapy Facilty/Provider(s): NA Reason for Treatment: NA  Prior Outpatient Therapy Prior Outpatient Therapy: Yes Prior Therapy Dates: CURRENT Prior Therapy Facilty/Provider(s): DR. [facility] Reason for Treatment: MED MANAGEMENT  ADL Screening (condition at time of admission) Patient's cognitive ability adequate to safely complete daily activities?: Yes Patient able to express need for assistance with ADLs?: Yes Independently performs ADLs?: Yes (appropriate for developmental age)       Abuse/Neglect Assessment (Assessment to be complete while patient is alone) Physical Abuse: Denies Verbal Abuse: Denies Sexual Abuse: Denies Values / Beliefs Cultural Requests During Hospitalization: None Spiritual Requests During Hospitalization: None        Additional Information 1:1 In Past 12 Months?: No CIRT Risk: No Elopement Risk: No Does patient have medical clearance?: Yes     Disposition:Patient discharged home to live with her sister. She will follow with her psychiatrist, Carroll Sage MD. She will see her psychologist in [place] on 03/11/12 for follow up. CPS was contacted to advise them that patient was being discharged home. Dr Manus Gunning is in agreement with this disposition.  Disposition Disposition of Patient: Outpatient treatment;Referred to (current provider) Type of inpatient treatment program: Adult Type of outpatient treatment: Adult Patient referred to: Other (Comment) (current  provider-Dr Carroll Sage)  On Site Evaluation by:   Reviewed with Physician:     Jearld Pies 03/10/2012 2:08 PM

## 2012-03-10 NOTE — ED Notes (Signed)
Pt ate all of her breakfast. NAD. Sitter remains at bedside.

## 2012-03-10 NOTE — Clinical Social Work Note (Signed)
CSW spoke with RN who reports pt is going home and will stay with her sister. CSW called and left voicemail for Melina Schools, CPS caseworker, notifying her of d/c to sister's home in case follow up is needed.   [name], [place] [phone]

## 2012-03-18 ENCOUNTER — Encounter (HOSPITAL_COMMUNITY): Payer: Self-pay

## 2012-03-18 ENCOUNTER — Emergency Department (HOSPITAL_COMMUNITY)
Admission: EM | Admit: 2012-03-18 | Discharge: 2012-03-18 | Disposition: A | Discharge status: Home / Self Care | Payer: Medicaid Other | Attending: Emergency Medicine | Admitting: Emergency Medicine

## 2012-03-18 DIAGNOSIS — L290 Pruritus ani: Secondary | ICD-10-CM | POA: Insufficient documentation

## 2012-03-18 HISTORY — DX: Enterobiasis: B80

## 2012-03-18 NOTE — ED Notes (Signed)
Have pinworms again per pt. Have seen them in my bowel movement per pt.

## 2012-03-18 NOTE — ED Notes (Signed)
Pt says she has pinworms, that she has seen them in her stool.  Had been treated before, but say she did not take the 2nd pill.  Alert.Pt s son is being seen also for pinworms

## 2012-03-18 NOTE — ED Notes (Signed)
Dr Cook in to see pt. 

## 2012-03-19 NOTE — ED Provider Notes (Signed)
Medical screening examination/treatment/procedure(s) were conducted as a shared visit with non-physician practitioner(s) and myself.  I personally evaluated the patient during the encounter.  Rectal exam reveals no evidence of pinworms  Donnetta Hutching, MD 03/19/12 2311

## 2012-03-19 NOTE — ED Provider Notes (Signed)
History     CSN: [id number]  Arrival date & time 03/18/12  2019   First MD Initiated Contact with Patient 03/18/12 2037      Chief Complaint  Patient presents with  . Pinworm     (Consider location/radiation/quality/duration/timing/severity/associated sxs/prior treatment) HPI Comments: [name] presents with complaint of seeing small clear segmented wormlike objects in her and her infant son's stool which have been present for some time.  She describes having pain at her rectum when she is passing a bm with these worms and also intermittently has anal pruritis which is mostly occuring at night.  She also notices the the same objects in her infant son's stool when she changes his diaper,  Who is also here to be evaluated.  She states he was treated for pinworms last month at [facility] and she only received half the dose from the pharmacy,  So is concerned it has returned.  She denies fever, chills or other complaint.  She states the last time she came for this problem (10 days ago) this complaint was ignored and she underwent a psych evaluation instead.  She denies hallucinations,  Stating what she finds in their is real.   The history is provided by the patient.    Past Medical History  Diagnosis Date  . Pinworms     Past Surgical History  Procedure Date  . Cesarean section     x2  . Appendectomy     History reviewed. No pertinent family history.  History  Substance Use Topics  . Smoking status: Never Smoker   . Smokeless tobacco: Not on file  . Alcohol Use: No    OB History    Grav Para Term Preterm Abortions TAB SAB Ect Mult Living                  Review of Systems  Constitutional: Negative for fever and chills.  HENT: Negative for sore throat.   Respiratory: Negative for chest tightness, shortness of breath and wheezing.   Cardiovascular: Negative for chest pain.  Gastrointestinal: Positive for rectal pain.  Skin: Negative for rash.    Neurological: Negative for numbness.  Psychiatric/Behavioral: Negative for confusion and agitation. The patient is not nervous/anxious.     Allergies  Review of patient's allergies indicates no known allergies.  Home Medications   Current Outpatient Rx  Name Route Sig Dispense Refill  . ALPRAZOLAM 1 MG PO TABS Oral Take 1 mg by mouth 4 (four) times daily as needed. Takes 2 tablets at bedtime every day and takes 1 tablet up to three times daily as needed for panic attacks    . AMPHETAMINE-DEXTROAMPHET ER 30 MG PO CP24 Oral Take 30 mg by mouth daily.    Marland Kitchen BUPRENORPHINE HCL-NALOXONE HCL 8-2 MG SL FILM Sublingual Place 1 Film under the tongue 3 (three) times daily.      BP 118/77  Pulse 113  Temp 98.2 F (36.8 C) (Oral)  Resp 20  Ht 5\' 1"  (1.549 m)  Wt 130 lb (58.968 kg)  BMI 24.56 kg/m2  SpO2 99%  LMP 02/22/2012  Physical Exam  Nursing note and vitals reviewed. Constitutional: She appears well-developed and well-nourished.  HENT:  Head: Normocephalic and atraumatic.  Eyes: Conjunctivae are normal.  Neck: Normal range of motion.  Cardiovascular: Normal rate, regular rhythm, normal heart sounds and intact distal pulses.   Pulmonary/Chest: Effort normal and breath sounds normal. She has no wheezes.  Abdominal: Soft. Bowel sounds are normal.  There is no tenderness.  Genitourinary: Rectal exam shows no tenderness.       Anus without abrasion, excoriations,  No foreign body or evidence of pinworms identified.  Musculoskeletal: Normal range of motion.  Neurological: She is alert.  Skin: Skin is warm and dry.  Psychiatric: She has a normal mood and affect.    ED Course  Procedures (including critical care time)  Labs Reviewed - No data to display No results found.   1. Rectal itching       MDM  Pt was evaluated also by Dr. Adriana Simas.  Advised pt that she needs to bring in a sample of the worms, collection container given.  Pt agrees with this plan.  She will return when  she has obtained a specimen.        Burgess Amor, [place] 03/19/12 [phone]

## 2012-03-20 NOTE — ED Notes (Signed)
Patient had brought in sample of stool from her son and herself. Patient stated that she was told by Dr. Adriana Simas to bring in sample later on if she was able to obtain one. Patient brought in for her sample a urine cup with a qtip in the specimen cup. Slight discoloration noted to tip of qtip, no stool in cup. Laboratory notified me that they could not see any pinworms under the microscope on the patient's qtip, and they also reported that they could not send it off due to no stool being in the sample.

## 2012-03-26 LAB — PINWORM PREP: Pinworm Prep - Enterobius: NONE SEEN

## 2012-06-09 ENCOUNTER — Emergency Department (HOSPITAL_COMMUNITY)
Admission: EM | Admit: 2012-06-09 | Discharge: 2012-06-10 | Disposition: A | Discharge status: Other Acute Inpatient Hospital | Payer: 59 | Source: Home / Self Care | Attending: Emergency Medicine | Admitting: Emergency Medicine

## 2012-06-09 ENCOUNTER — Encounter (HOSPITAL_COMMUNITY): Payer: Self-pay | Admitting: *Deleted

## 2012-06-09 DIAGNOSIS — F32A Depression, unspecified: Secondary | ICD-10-CM

## 2012-06-09 DIAGNOSIS — D497 Neoplasm of unspecified behavior of endocrine glands and other parts of nervous system: Secondary | ICD-10-CM | POA: Insufficient documentation

## 2012-06-09 DIAGNOSIS — Z8619 Personal history of other infectious and parasitic diseases: Secondary | ICD-10-CM | POA: Insufficient documentation

## 2012-06-09 DIAGNOSIS — Z79899 Other long term (current) drug therapy: Secondary | ICD-10-CM | POA: Insufficient documentation

## 2012-06-09 DIAGNOSIS — R45851 Suicidal ideations: Secondary | ICD-10-CM | POA: Insufficient documentation

## 2012-06-09 DIAGNOSIS — F329 Major depressive disorder, single episode, unspecified: Secondary | ICD-10-CM

## 2012-06-09 DIAGNOSIS — F3289 Other specified depressive episodes: Secondary | ICD-10-CM | POA: Insufficient documentation

## 2012-06-09 DIAGNOSIS — Z3202 Encounter for pregnancy test, result negative: Secondary | ICD-10-CM | POA: Insufficient documentation

## 2012-06-09 DIAGNOSIS — G479 Sleep disorder, unspecified: Secondary | ICD-10-CM | POA: Insufficient documentation

## 2012-06-09 DIAGNOSIS — F411 Generalized anxiety disorder: Secondary | ICD-10-CM | POA: Insufficient documentation

## 2012-06-09 HISTORY — DX: Neoplasm of unspecified behavior of endocrine glands and other parts of nervous system: D49.7

## 2012-06-09 HISTORY — DX: Depression, unspecified: F32.A

## 2012-06-09 HISTORY — DX: Major depressive disorder, single episode, unspecified: F32.9

## 2012-06-09 LAB — CBC WITH DIFFERENTIAL/PLATELET
Basophils Absolute: 0.1 10*3/uL (ref 0.0–0.1)
Eosinophils Relative: 1 % (ref 0–5)
Lymphocytes Relative: 12 % (ref 12–46)
Lymphs Abs: 1.3 10*3/uL (ref 0.7–4.0)
MCV: 87.8 fL (ref 78.0–100.0)
Neutrophils Relative %: 83 % — ABNORMAL HIGH (ref 43–77)
Platelets: 257 10*3/uL (ref 150–400)
RBC: 4.74 MIL/uL (ref 3.87–5.11)
RDW: 12.9 % (ref 11.5–15.5)
WBC: 11.1 10*3/uL — ABNORMAL HIGH (ref 4.0–10.5)

## 2012-06-09 LAB — BASIC METABOLIC PANEL
CO2: 29 mEq/L (ref 19–32)
Calcium: 9.7 mg/dL (ref 8.4–10.5)
GFR calc non Af Amer: >90 mL/min (ref >90)
Potassium: 3.3 mEq/L — ABNORMAL LOW (ref 3.5–5.1)
Sodium: 140 mEq/L (ref 135–145)

## 2012-06-09 LAB — RAPID URINE DRUG SCREEN, HOSP PERFORMED
Cocaine: NOT DETECTED
Opiates: NOT DETECTED

## 2012-06-09 LAB — PREGNANCY, URINE: Preg Test, Ur: NEGATIVE

## 2012-06-09 MED ORDER — POTASSIUM CHLORIDE CRYS ER 20 MEQ PO TBCR
40.0000 meq | EXTENDED_RELEASE_TABLET | Freq: Once | ORAL | Status: AC
  Administered 2012-06-09: 40 meq via ORAL
  Filled 2012-06-09: qty 2

## 2012-06-09 MED ORDER — SODIUM CHLORIDE 0.9 % IV BOLUS (SEPSIS)
1000.0000 mL | Freq: Once | INTRAVENOUS | Status: AC
  Administered 2012-06-09: 1000 mL via INTRAVENOUS

## 2012-06-09 NOTE — ED Provider Notes (Signed)
History   This chart was scribed for Dione Booze, MD by Gerlean Ren, ED Scribe. This patient was seen in room APA02/APA02 and the patient's care was started at 3:46 PM    CSN: [id number]  Arrival date & time 06/09/12  1515   First MD Initiated Contact with Patient 06/09/12 1540      Chief Complaint  Patient presents with  . V70.1    The history is provided by the patient. No language interpreter was used.   [name] is a 31 y.o. female who presents to the Emergency Department complaining of worsening depressed mood with associated difficulty sleeping for past 2-3 months since her husband went to prison.  Pt denies suicidal and homicidal ideation and visual/auditory hallucinations.  Pt has h/o depression at 31 y.o.  Pt denies tobacco and alcohol use. PCP is Dr. Felecia Shelling.   Past Medical History  Diagnosis Date  . Pinworms   . Depression   . Pituitary tumor     Past Surgical History  Procedure Date  . Cesarean section     x2  . Appendectomy   . Pinched nerve rt arm.   . Ectopic pregnancy surgery   . Tubal ligation     History reviewed. No pertinent family history.  History  Substance Use Topics  . Smoking status: Never Smoker   . Smokeless tobacco: Not on file  . Alcohol Use: No    No OB history provided.   Review of Systems  Neurological: Negative for seizures.  Psychiatric/Behavioral: Positive for sleep disturbance. Negative for suicidal ideas, hallucinations and self-injury.  All other systems reviewed and are negative.    Allergies  Hydrocodone  Home Medications   Current Outpatient Rx  Name  Route  Sig  Dispense  Refill  . ALPRAZOLAM 1 MG PO TABS   Oral   Take 1 mg by mouth 4 (four) times daily as needed. Takes 2 tablets at bedtime every day and takes 1 tablet up to three times daily as needed for panic attacks         . AMPHETAMINE-DEXTROAMPHET ER 30 MG PO CP24   Oral   Take 30 mg by mouth daily.         Marland Kitchen BUPRENORPHINE HCL-NALOXONE  HCL 8-2 MG SL FILM   Sublingual   Place 1 Film under the tongue 3 (three) times daily.           BP 127/91  Pulse 119  Temp 98 F (36.7 C) (Oral)  Resp 18  Ht 5\' 1"  (1.549 m)  Wt 135 lb (61.236 kg)  BMI 25.51 kg/m2  SpO2 99%  LMP 05/22/2012  Physical Exam  Nursing note and vitals reviewed. Constitutional: She is oriented to person, place, and time. She appears well-developed and well-nourished.  HENT:  Head: Normocephalic and atraumatic.  Eyes: EOM are normal.  Neck: Neck supple. No tracheal deviation present.  Cardiovascular: Regular rhythm and normal heart sounds.   No murmur heard.      Tachycardic.  Pulmonary/Chest: Effort normal and breath sounds normal. She has no wheezes.  Abdominal: Bowel sounds are normal.  Musculoskeletal: Normal range of motion.  Neurological: She is alert and oriented to person, place, and time.  Skin: Skin is warm and dry.  Psychiatric: She has a normal mood and affect. Her behavior is normal.       Appears mildly depressed.    ED Course  Procedures (including critical care time) DIAGNOSTIC STUDIES: Oxygen Saturation is 99% on room  air, normal by my interpretation.    COORDINATION OF CARE: 3:50 PM- Patient informed of clinical course, understands medical decision-making process, and agrees with plan.  Ordered ethanol, CBC, b-met, pregnancy test, and drug screen panel.     Labs Reviewed  CBC WITH DIFFERENTIAL  BASIC METABOLIC PANEL  PREGNANCY, URINE  URINE RAPID DRUG SCREEN (HOSP PERFORMED)  ETHANOL   Results for orders placed during the hospital encounter of 06/09/12  CBC WITH DIFFERENTIAL      Component Value Range   WBC 11.1 (*) 4.0 - 10.5 K/uL   RBC 4.74  3.87 - 5.11 MIL/uL   Hemoglobin 14.8  12.0 - 15.0 g/dL   HCT 86.5  78.4 - 69.6 %   MCV 87.8  78.0 - 100.0 fL   MCH 31.2  26.0 - 34.0 pg   MCHC 35.6  30.0 - 36.0 g/dL   RDW 29.5  28.4 - 13.2 %   Platelets 257  150 - 400 K/uL   Neutrophils Relative 83 (*) 43 - 77 %    Neutro Abs 9.2 (*) 1.7 - 7.7 K/uL   Lymphocytes Relative 12  12 - 46 %   Lymphs Abs 1.3  0.7 - 4.0 K/uL   Monocytes Relative 4  3 - 12 %   Monocytes Absolute 0.4  0.1 - 1.0 K/uL   Eosinophils Relative 1  0 - 5 %   Eosinophils Absolute 0.1  0.0 - 0.7 K/uL   Basophils Relative 1  0 - 1 %   Basophils Absolute 0.1  0.0 - 0.1 K/uL  BASIC METABOLIC PANEL      Component Value Range   Sodium 140  135 - 145 mEq/L   Potassium 3.3 (*) 3.5 - 5.1 mEq/L   Chloride 102  96 - 112 mEq/L   CO2 29  19 - 32 mEq/L   Glucose, Bld 116 (*) 70 - 99 mg/dL   BUN 9  6 - 23 mg/dL   Creatinine, Ser 4.40  0.50 - 1.10 mg/dL   Calcium 9.7  8.4 - 10.2 mg/dL   GFR calc non Af Amer >90  >90 mL/min   GFR calc Af Amer >90  >90 mL/min  PREGNANCY, URINE      Component Value Range   Preg Test, Ur NEGATIVE  NEGATIVE  URINE RAPID DRUG SCREEN (HOSP PERFORMED)      Component Value Range   Opiates NONE DETECTED  NONE DETECTED   Cocaine NONE DETECTED  NONE DETECTED   Benzodiazepines POSITIVE (*) NONE DETECTED   Amphetamines POSITIVE (*) NONE DETECTED   Tetrahydrocannabinol NONE DETECTED  NONE DETECTED   Barbiturates NONE DETECTED  NONE DETECTED  ETHANOL      Component Value Range   Alcohol, Ethyl (B) <11  0 - 11 mg/dL     1. Depression       MDM  Depression without suicidal ideation or hallucinations. I do not feel that she will meet criteria for inpatient care, consultation will be obtained with ACT Team To try and set her up with outpatient resources.  Patient has been evaluated by ACT Team who found that she would not contract for safety. Therefore, she actually will need inpatient care.  I personally performed the services described in this documentation, which was scribed in my presence. The recorded information has been reviewed and is accurate.           Dione Booze, MD 06/10/12 [phone]

## 2012-06-09 NOTE — ED Notes (Signed)
ACT team consult completed.  Pt has been referred to behavorial health

## 2012-06-09 NOTE — ED Notes (Signed)
Pt reports took medication listed in triage note over the course of the night and early this morning.  Pt says was not trying to kill herself, says she is depressed, has a lot going on, and wanted to get some sleep.  Pt said her sister had a hard time waking her up.  Pt alert and oriented, answering questions appropriately.  C/O feeling drowsy.  Pt sitting up in bed watching TV.

## 2012-06-09 NOTE — ED Notes (Signed)
Pt alert and cooperative.  Family at bedside.  EDP notified that family would like to speak with him.  ACT consult requested.

## 2012-06-09 NOTE — ED Notes (Signed)
Pt was wanded by security, belongings removed from room and locked in cabinet, sitter at bedside.  Pt has r wrist brace on and it was also removed and searched.

## 2012-06-09 NOTE — ED Notes (Signed)
Pt requesting to be inpt for treatment of depression.  States all she does is cry and lay on the couch and she feels like she needs treatment.  edp notified and informed pt to notify ACT also.
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
## 2012-06-09 NOTE — ED Notes (Addendum)
Took xanax 1mg    5-6 tablets, neurontin 400 mg  Took 5-6 tablets last night,  No alcohol.  Took suboxin 8 mg  1 tablet,    Awakened with difficulty this am and brought to Er by sister.  Says she has been depressed.    Has started prozac 20 mg 1 tablet.  Has a velco splint on there rt wrist due to nerve pain.  Alert, talking and ambulates normally

## 2012-06-10 ENCOUNTER — Encounter (HOSPITAL_COMMUNITY): Payer: Self-pay

## 2012-06-10 ENCOUNTER — Inpatient Hospital Stay (HOSPITAL_COMMUNITY)
Admission: EM | Admit: 2012-06-10 | Discharge: 2012-06-15 | DRG: 885 | Disposition: A | Discharge status: Home / Self Care | Payer: 59 | Source: Ambulatory Visit | Attending: Psychiatry | Admitting: Psychiatry

## 2012-06-10 DIAGNOSIS — F329 Major depressive disorder, single episode, unspecified: Principal | ICD-10-CM | POA: Diagnosis present

## 2012-06-10 DIAGNOSIS — Z79899 Other long term (current) drug therapy: Secondary | ICD-10-CM

## 2012-06-10 DIAGNOSIS — F32A Depression, unspecified: Secondary | ICD-10-CM

## 2012-06-10 DIAGNOSIS — F1310 Sedative, hypnotic or anxiolytic abuse, uncomplicated: Secondary | ICD-10-CM | POA: Diagnosis present

## 2012-06-10 MED ORDER — ACETAMINOPHEN 325 MG PO TABS: 650.0000 mg | ORAL_TABLET | Freq: Four times a day (QID) | ORAL | Status: DC | PRN

## 2012-06-10 MED ORDER — MAGNESIUM HYDROXIDE 400 MG/5ML PO SUSP: 30.0000 mL | Freq: Every day | ORAL | Status: DC | PRN

## 2012-06-10 MED ORDER — IBUPROFEN 400 MG PO TABS: 600.0000 mg | ORAL_TABLET | Freq: Three times a day (TID) | ORAL | Status: DC | PRN

## 2012-06-10 MED ORDER — LORAZEPAM 1 MG PO TABS: 1.0000 mg | ORAL_TABLET | Freq: Three times a day (TID) | ORAL | Status: DC | PRN

## 2012-06-10 MED ORDER — BUPRENORPHINE HCL-NALOXONE HCL 8-2 MG SL FILM
1.0000 | ORAL_FILM | Freq: Three times a day (TID) | SUBLINGUAL | Status: DC
  Filled 2012-06-10 (×4): qty 1

## 2012-06-10 MED ORDER — CLONIDINE HCL 0.1 MG PO TABS
0.1000 mg | ORAL_TABLET | Freq: Four times a day (QID) | ORAL | Status: AC
  Administered 2012-06-10 – 2012-06-11 (×4): 0.1 mg via ORAL
  Filled 2012-06-10 (×11): qty 1

## 2012-06-10 MED ORDER — DICYCLOMINE HCL 20 MG PO TABS: 20.0000 mg | ORAL_TABLET | Freq: Four times a day (QID) | ORAL | Status: DC | PRN

## 2012-06-10 MED ORDER — AMPHETAMINE-DEXTROAMPHET ER 30 MG PO CP24
30.0000 mg | ORAL_CAPSULE | Freq: Every day | ORAL | Status: DC
  Administered 2012-06-10: 30 mg via ORAL
  Filled 2012-06-10: qty 1

## 2012-06-10 MED ORDER — NICOTINE 14 MG/24HR TD PT24: 14.0000 mg | MEDICATED_PATCH | Freq: Every day | TRANSDERMAL | Status: DC

## 2012-06-10 MED ORDER — CLONIDINE HCL 0.1 MG PO TABS
0.1000 mg | ORAL_TABLET | ORAL | Status: AC
  Administered 2012-06-14: 0.1 mg via ORAL
  Filled 2012-06-10 (×4): qty 1

## 2012-06-10 MED ORDER — HYDROXYZINE HCL 25 MG PO TABS
25.0000 mg | ORAL_TABLET | Freq: Four times a day (QID) | ORAL | Status: DC | PRN
  Administered 2012-06-10 – 2012-06-13 (×4): 25 mg via ORAL

## 2012-06-10 MED ORDER — CLONIDINE HCL 0.1 MG PO TABS
0.1000 mg | ORAL_TABLET | Freq: Every day | ORAL | Status: DC
  Filled 2012-06-10 (×2): qty 1

## 2012-06-10 MED ORDER — INFLUENZA VIRUS VACC SPLIT PF IM SUSP
0.5000 mL | INTRAMUSCULAR | Status: AC
  Administered 2012-06-11: 0.5 mL via INTRAMUSCULAR

## 2012-06-10 MED ORDER — ALUM & MAG HYDROXIDE-SIMETH 200-200-20 MG/5ML PO SUSP: 30.0000 mL | ORAL | Status: DC | PRN

## 2012-06-10 MED ORDER — ONDANSETRON HCL 4 MG PO TABS: 4.0000 mg | ORAL_TABLET | Freq: Three times a day (TID) | ORAL | Status: DC | PRN

## 2012-06-10 MED ORDER — BUPRENORPHINE HCL-NALOXONE HCL 8-2 MG SL SUBL
1.0000 | SUBLINGUAL_TABLET | Freq: Three times a day (TID) | SUBLINGUAL | Status: DC
  Administered 2012-06-10: 1 via SUBLINGUAL
  Filled 2012-06-10 (×3): qty 1

## 2012-06-10 MED ORDER — METHOCARBAMOL 500 MG PO TABS
500.0000 mg | ORAL_TABLET | Freq: Three times a day (TID) | ORAL | Status: DC | PRN
  Administered 2012-06-10 – 2012-06-15 (×8): 500 mg via ORAL
  Filled 2012-06-10 (×8): qty 1

## 2012-06-10 MED ORDER — CHLORDIAZEPOXIDE HCL 25 MG PO CAPS
25.0000 mg | ORAL_CAPSULE | Freq: Four times a day (QID) | ORAL | Status: DC
  Administered 2012-06-10 – 2012-06-15 (×20): 25 mg via ORAL
  Filled 2012-06-10 (×20): qty 1

## 2012-06-10 MED ORDER — ONDANSETRON 4 MG PO TBDP: 4.0000 mg | ORAL_TABLET | Freq: Four times a day (QID) | ORAL | Status: DC | PRN

## 2012-06-10 MED ORDER — ZOLPIDEM TARTRATE 5 MG PO TABS: 5.0000 mg | ORAL_TABLET | Freq: Every evening | ORAL | Status: DC | PRN

## 2012-06-10 MED ORDER — ACETAMINOPHEN 325 MG PO TABS: 650.0000 mg | ORAL_TABLET | ORAL | Status: DC | PRN

## 2012-06-10 MED ORDER — NAPROXEN 500 MG PO TABS
500.0000 mg | ORAL_TABLET | Freq: Two times a day (BID) | ORAL | Status: DC | PRN
  Administered 2012-06-11 – 2012-06-15 (×5): 500 mg via ORAL
  Filled 2012-06-10 (×5): qty 1

## 2012-06-10 MED ORDER — LOPERAMIDE HCL 2 MG PO CAPS: 2.0000 mg | ORAL_CAPSULE | ORAL | Status: DC | PRN

## 2012-06-10 NOTE — ED Notes (Signed)
Spoke with Britta Mccreedy at [facility].  She relays that after speaking with the [facility] and physician, the determination is to have pt reassessed in the morning, prior to bed assignment.

## 2012-06-10 NOTE — [facility] Counselor (Signed)
The patient has been accepted to the service of Dr Daleen Bo  As Drvoluntary admission.  She is assigned to room 504-02. She will be transported by [facility]. Support paperwork completed and distributed. Medicaid preserted through [facility]. Spoke with Arvid Right P. Who authorized 3 days with review on 06/12/2012. Authorization # is  [id number].  Dr Adriana Simas advised of the acceptance and is in agreement with it.

## 2012-06-10 NOTE — [facility] Counselor (Addendum)
The patient was referred to [facility] last night. There were some question regarding admission. Reassessment was requested. This morning the patient remains suicidal. She continues to take medications in excess to go to sleep. She admits to taking up to 5 Xanax  To go to sleep, even though she knows that 2 would work. She will take other  medications in addition to the Xanax, such as Neurotin and  Sabixon. This was what she did last night. She does not know why she take medications in such quanties.  She remains fearful and unable to contract for safety. She feels depressed daily with crying spells, isolation, lose of appetite with a weight lose of 10-15 pounds, and fatigue. She complains of racing thoughts, especially when she tries to rest, she describes this as not being able to turn her brain off. She denies any hallucinations at this time and does not appear to be delusional as she has presented in the past. She is not homicidal and has no history of violence.She is keeping her appointment and is taking her medications. Her next follow up with Dr. Ronda Fairly is in January and follow up with A Wilson,therapist is in December. Patient reports her stressors as the lose of her spouse from the home until 2015(he is in jail),no permanent place to live, financial problems, and issues of care for her child when she needs to be away(her parents are elder). As the patient has a history of overdosing, and admits to 2 previous attempts to her life by overdose, and is unable to contract  for safety, we will seek inpatient treatment. Discussed with Dr Adriana Simas and he is in agreement with this recommendation.

## 2012-06-10 NOTE — ED Notes (Signed)
Spoke with Britta Mccreedy from [facility], to clarify questions regarding pt's history.  Pt still awaiting bed assignment.

## 2012-06-10 NOTE — Progress Notes (Signed)
Psychoeducational Group Note  Date:  06/10/2012 Time: 2000  Group Topic/Focus:  Wrap-Up Group:   The focus of this group is to help patients review their daily goal of treatment and discuss progress on daily workbooks.  Participation Level:  Active  Participation Quality:  Sharing  Affect:  Depressed  Cognitive:  Oriented  Insight:  Good  Engagement in Group:  Good  Additional Comments:  Patient shared that she was looking forward to getting to her baby.  Toni Demo, Newton Pigg 06/10/2012, 9:45 PM

## 2012-06-10 NOTE — Tx Team (Signed)
Initial Interdisciplinary Treatment Plan  PATIENT STRENGTHS: (choose at least two) Ability for insight General fund of knowledge Supportive family/friends  PATIENT STRESSORS: Health problems Legal issue Marital or family conflict   PROBLEM LIST: Problem List/Patient Goals Date to be addressed Date deferred Reason deferred Estimated date of resolution  Suicidal Ideations      Depression                                                 DISCHARGE CRITERIA:  Improved stabilization in mood, thinking, and/or behavior Need for constant or close observation no longer present  PRELIMINARY DISCHARGE PLAN: Attend aftercare/continuing care group Outpatient therapy  PATIENT/FAMIILY INVOLVEMENT: This treatment plan has been presented to and reviewed with the patient, [name], and/or family member, .  The patient and family have been given the opportunity to ask questions and make suggestions.  Noah Charon 06/10/2012, 3:08 PM

## 2012-06-10 NOTE — ED Notes (Signed)
Patient has been asleep for majority of the night.

## 2012-06-10 NOTE — ED Notes (Signed)
Pt accepted at [facility].  [facility] on the way to pick up pt

## 2012-06-11 MED ORDER — FLUOXETINE HCL 10 MG PO CAPS
10.0000 mg | ORAL_CAPSULE | Freq: Every day | ORAL | Status: DC
  Administered 2012-06-11 – 2012-06-12 (×2): 10 mg via ORAL
  Filled 2012-06-11 (×5): qty 1

## 2012-06-11 NOTE — Progress Notes (Signed)
Discharge Planning Group 8:30-9:30 Patient rates depression at 1/2 and anxiety at zero.  She denies SI/HI at this time.  Patient states that not having her husband at home and lack of a stable living situation are sources of stress in her life at this time.  Her current provider is Dr. Tiburcio Pea and would like to follow up with him upon her discharge.  [facility] Group Notes:  (Counselor/Nursing/MHT/Case Management/Adjunct)   Living a Balanced Life 1:15-2:30    06/11/2012 3:03 PM  Type of Therapy:  Group Therapy  Participation Level:  Minimal  Participation Quality:  Appropriate and Drowsy  Affect:  Appropriate  Cognitive:  Appropriate  Insight:  None  Engagement in Group:  None  Engagement in Therapy:  None  Modes of Intervention:  Education, Orientation and Support  Summary of Progress/Problems: Patient shared that one of her strengths is that she is a good mom.  She appeared drowsy and she did not actively engage in the group session.    Tyisha Cressy L 06/11/2012, 3:03 PM

## 2012-06-11 NOTE — Progress Notes (Addendum)
Patient ID: KEITY CRESPI, female   DOB: 23-Feb-1981, 31 y.o.   MRN: [id number] D: Pt. Reports anxiety at "6" of 10. "don't like being in a room by myself.  Pt. Tearful says she misses her baby "he's one" Pt. Says son is with her sister. Pt. Has arm brace to left arm. Pt. Reports nerve damage. Say she is to have surgery next week.  A: Writer provided emotional support by listening and encouraging pt. To gain coping skills so she be more focused which will be beneficial for herself and baby. Pt. Encouraged to move finger.  Pt. Encouraged to attend group. Staff will monitor q55min for safety. R: Pt. Is safe on the unit. Pt. Attended group.  Pt. Able to move fingers, but reports with difficulty.
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
## 2012-06-11 NOTE — Progress Notes (Signed)
Psychoeducational Group Note  Date:  06/11/2012 Time:  2000   Group Topic/Focus:  Karaoke  Participation Level:  Active  Participation Quality:  Appropriate  Affect:  Blunted and Flat  Cognitive:  Appropriate  Insight:  Good  Engagement in Group:  Good  Additional Comments:    Humberto Seals Monique 06/11/2012, 10:37 PM

## 2012-06-11 NOTE — Progress Notes (Signed)
D: Patient pleasant and cooperative with staff. Sad at times. She reported on self inventory sheet that her energy level is normal and ability to pay attention is good. Patient rated depression and feelings of hopelessness "4".  A: Support and encouragement provided to patient. Scheduled medications administered per MD orders. Maintain 15 minute checks for safety.   R: Patient receptive. Denies SI/HI/AVH. Patient remains safe.

## 2012-06-11 NOTE — Progress Notes (Signed)
Psychoeducational Group Note  Date:  06/11/2012 Time:  1000  Group Topic/Focus:  therapeutic activity  Participation Level:  Active  Participation Quality:  Appropriate and Attentive  Affect:  Appropriate  Cognitive:  Alert and Appropriate  Insight:  Good  Engagement in Group:  Good  Additional Comments:  Pt participated in group.  Marquis Lunch, Lateef Juncaj 06/11/2012, 11:19 AM
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
## 2012-06-12 MED ORDER — FLUOXETINE HCL 20 MG PO CAPS
20.0000 mg | ORAL_CAPSULE | Freq: Every day | ORAL | Status: DC
  Administered 2012-06-12 – 2012-06-15 (×4): 20 mg via ORAL
  Filled 2012-06-12 (×5): qty 1

## 2012-06-12 NOTE — Progress Notes (Signed)
Patient ID: [name], female   DOB: Sep 03, 1980, 31 y.o.   MRN: [id number] D: pt. Interacts on the milieu. Pt. C/o pain right arm, med given(see MAR) Pt. Concerned that BP was low. A: Writer reviewed meds encouraged pt. To drink more fluids and reviewed fall risk associated with hypotension. Plan hold bedtime Clonidine if BP parameters not met.  Staff will monitor q26min for safety. Staff encouraged karaoke.R:  No clonidine give at bedtime.(see VS), pt. Is safe on the unit and did attend karaoke.

## 2012-06-12 NOTE — Progress Notes (Signed)
[facility] Group Notes:  (Counselor/Nursing/MHT/Case Management/Adjunct)  06/12/2012 1:25 PM  Type of Therapy:  Therapeutic Activity- Apples to Apples  Participation Level:  Active  Participation Quality:  Appropriate, Attentive and Sharing  Affect:  Appropriate  Cognitive:  Appropriate  Insight:  Good  Engagement in Group:  Good  Engagement in Therapy:  Good  Modes of Intervention:  Activity  Summary of Progress/Problems: Patient participated in therapeutic activity on apples to apples and patients were able to express how certain words are related within the game and patient was able to learn coping skills through the game by being able to learn from others.    Karleen Hampshire Brittini 06/12/2012, 1:25 PM

## 2012-06-12 NOTE — Progress Notes (Signed)
  D) Patient pleasant and cooperative upon my assessment. Patient continues to appear sad and depressed. Patient states slept " well," and  appetite is " good." Patient rates depression as  1 /10, patient rates hopeless feelings as 0 /10. Patient denies SI/HI, denies A/V hallucinations.   A) Patient offered support and encouragement, patient encouraged to discuss feelings/concerns with staff. Patient verbalized understanding. Patient monitored Q15 minutes for safety. Patient met with MD to discuss today's goals and plan of care.  R) Patient active on unit, attending groups in day room and meals in dining room.  Patient insightful with a plan to take better care of herself and "stay strong and love myself" moving forward.  Patient taking medications as ordered. Will continue to monitor.

## 2012-06-12 NOTE — Clinical Social Work Note (Signed)
Discharge Planning Group 8:30-9:30 AM 06/12/2012   Patient seen during discharge planning group.  She denies SI/HI and rates all symptoms at one.  Patient is hopeful to discharge home soon.

## 2012-06-12 NOTE — Clinical Social Work Note (Signed)
[facility] Group Notes:  (Counselor/Nursing/MHT/Case Management/Adjunct)        Feelings Around Relapse  06/12/2012   1:15   Type of Therapy: Group  Participation Level:  Active  Participation Quality:  Attentive  Affect:  Appropriate  Cognitive:  Alert and Appropriate  Insight:  Good  Engagement in Group:  Good  Engagement in Therapy:  Good  Modes of Intervention:  Education, Problem-solving, Support and Exploration  Summary of Progress/Problems:  [name] shared she will need to stay away from negative people in order not to relapse into negative patterns.  She stated she wants to recovered and be happy again.   Wynn Banker 06/12/2012 11:11 AM

## 2012-06-12 NOTE — Tx Team (Signed)
Interdisciplinary Treatment Plan Update (Adult)  Date:  06/12/2012  Time Reviewed:  9:46 AM   Progress in Treatment: Attending groups:   Yes   Participating in groups:  Yes Taking medication as prescribed:  Yes Tolerating medication:  Yes Family/Significant othe contact made: Contact to be made with family Patient understands diagnosis:  Yes Discussing patient identified problems/goals with staff: Yes Medical problems stabilized or resolved: Yes Denies suicidal/homicidal ideation:Yes Issues/concerns per patient self-inventory:  Other:   New problem(s) identified:  Reason for Continuation of Hospitalization: Anxiety Depression Medication stabilization   Interventions implemented related to continuation of hospitalization:  Medication Management; safety checks q 15 mins  Additional comments:  Estimated length of stay:  2-3 days  Discharge Plan:  Home with outpatient follow up  New goal(s):  Review of initial/current patient goals per problem list:    1.  Goal(s): Eliminate SI/other thoughts of self harm   Met:  Yes  Target date: d/c  As evidenced by: Patient no longer endorsing SI/HI or other thoughts of self harm.    2.  Goal (s):Reduce depression/anxiety (rated at one today)  Met: Yes  Target date: d/c  As evidenced by: Patient currently rating symptoms at four or below    3.  Goal(s):.stabilize on meds   Met:  No  Target date: d/c  As evidenced by: Patient will report being stabilized on medications - less symptomatic    4.  Goal(s): Refer for outpatient follow up   Met:  No  Target date: d/c  As evidenced by: Follow up appointment scheduled    Attendees: Patient:   06/12/2012 9:46 AM  Physican:  Patrick North, MD 06/12/2012 9:46 AM  Nursing:  Neill Loft, RN 06/12/2012 9:46 AM   Nursing:   Quintella Reichert, RN 06/12/2012 9:46 AM   Clinical Social Worker:  Juline Patch, LCSW 06/12/2012 9:46 AM   Other: Serena Colonel, FNP  06/12/2012 9:46 AM   Other: Teddy Spike, MSW Intern     06/12/2012 06/12/2012 Other:        06/12/2012 9:46 AM
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
## 2012-06-13 DIAGNOSIS — F3289 Other specified depressive episodes: Secondary | ICD-10-CM

## 2012-06-13 DIAGNOSIS — F329 Major depressive disorder, single episode, unspecified: Secondary | ICD-10-CM | POA: Diagnosis present

## 2012-06-13 MED ORDER — GABAPENTIN 400 MG PO CAPS
400.0000 mg | ORAL_CAPSULE | Freq: Three times a day (TID) | ORAL | Status: DC
  Administered 2012-06-13 – 2012-06-15 (×7): 400 mg via ORAL
  Filled 2012-06-13 (×9): qty 1

## 2012-06-13 NOTE — Progress Notes (Signed)
Patient ID: [name], female   DOB: 1980/12/25, 31 y.o.   MRN: [id number] D: pt. In day room watching TV.  Pt. Reports neurotin started today for pain in right arm.  Pt. Says she suppose to have surgery on her arm. Pt. Has support brace on her arm. A: Writer assess movement of right fingers, able to move with little difficulty. Writer provide positive support by reporting next time she can have pain med.  Staff will monitor q15min for safety.  Staff encouraged group. R: Pt. Is safe on the unit; pt. Attends and participates in group.

## 2012-06-13 NOTE — Clinical Social Work Note (Signed)
[facility] Group Notes:  (Clinical Social Work)  06/13/2012  3:00-4:00PM  Summary of Progress/Problems:   The main focus of today's process group was for the patient to identify ways in which they have in the past sabotaged their own recovery and reasons they may have done this/what they received from doing it. We then worked to identify a specific plan of identifying self-sabotage statements/practicing following them with a replacement statement, in order to avoid doing this when discharged from the hospital for this admission. The patient expressed that she tells herself "I'll go out tomorrow" and ends up isolating herself, and this is an ongoing self-sabotaging behavior.  She came up with other statements to replace that one.  Type of Therapy:  Group Therapy - Process  Participation Level:  Active  Participation Quality:  Appropriate, Attentive and Sharing  Affect:  Depressed  Cognitive:  Alert and Oriented  Insight:  Good  Engagement in Group:  Good  Engagement in Therapy:  Good  Modes of Intervention:  Clarification, Education, Limit-setting, Problem-solving, Socialization, Support and Processing   Ambrose Mantle, LCSW 06/13/2012, 5:22 PM

## 2012-06-13 NOTE — Progress Notes (Signed)
Patient ID: [name], female   DOB: 1980/10/05, 31 y.o.   MRN: [id number]  D: Patient appears  depressed. Calm and cooperative with assessment. No acute distressed noted. Denies SI/HI/AVH .  Pt offered no additional question or concerns. Pt complains anxiety level is increasing. Pt complains nerve pain in R-arm is 8 out of 10.  A: safety has been maintained with Q15 minutes observation. Support and encouragement provided  Scheduled medication given. PRN vistaril given for anxiety, PRN Robaxin given for nerve pain in R-arm  R: Patient remains safe.  he is complaint with medication and group programming. Safety has been maintained Q15 and continue current POC. Pt says anxiety level decreased from earlier. Pt says nerve pain decreased to 5 out of 10.

## 2012-06-13 NOTE — Progress Notes (Signed)
[facility] MD Progress Note  06/13/2012 1:22 PM [name]  MRN:  [id number]  Diagnosis:  Assessment:  AXIS I: Depressive Disorder NOS  AXIS II: No diagnosis  AXIS III:  Past Medical History   Diagnosis  Date   .  Pinworms    .  Depression    .  Pituitary tumor     AXIS IV: problems related to legal system/crime  AXIS V: 51-60 moderate symptoms   ADL's:  Intact  Sleep: Fair  Appetite:  Fair  Suicidal Ideation:  Not too good records show 4 hours. Homicidal Ideation:  denies  AEB (as evidenced by): Subjective: Met with [name] 1:1 to discuss her situation. She states she is doing well with the exception of her elbow where she has a pinched nerve. Mental Status Examination/Evaluation: Objective:  Appearance: Casual  Eye Contact::  Good  Speech:  Clear and Coherent  Volume:  Normal  Mood:  Anxious and Depressed  Affect:  Congruent  Thought Process:  Goal Directed  Orientation:  Full  Thought Content:  WDL  Suicidal Thoughts:  No  Homicidal Thoughts:  No  Memory:  Immediate;   Good  Judgement:  Intact  Insight:  Lacking  Psychomotor Activity:  Normal  Concentration:  Fair  Recall:  Fair  Akathisia:  No  Handed:  Right  AIMS (if indicated):     Assets:  Communication Skills Desire for Improvement Housing  Sleep:  Number of Hours: 4.5    Vital Signs:Blood pressure 107/66, pulse 91, temperature 97.1 F (36.2 C), temperature source Oral, resp. rate 16, height 5\' 2"  (1.575 m), weight 60.782 kg (134 lb), last menstrual period 05/22/2012. Current Medications: Current Facility-Administered Medications  Medication Dose Route Frequency Provider Last Rate Last Dose  . acetaminophen (TYLENOL) tablet 650 mg  650 mg Oral Q6H PRN Nanine Means, NP      . alum & mag hydroxide-simeth (MAALOX/MYLANTA) 200-200-20 MG/5ML suspension 30 mL  30 mL Oral Q4H PRN Nanine Means, NP      . chlordiazePOXIDE (LIBRIUM) capsule 25 mg  25 mg Oral QID Nanine Means, NP   25 mg at 06/13/12 1206    . [EXPIRED] cloNIDine (CATAPRES) tablet 0.1 mg  0.1 mg Oral QID Nanine Means, NP   0.1 mg at 06/11/12 1216   Followed by  . cloNIDine (CATAPRES) tablet 0.1 mg  0.1 mg Oral BH-qamhs Nanine Means, NP       Followed by  . cloNIDine (CATAPRES) tablet 0.1 mg  0.1 mg Oral QAC breakfast Nanine Means, NP      . dicyclomine (BENTYL) tablet 20 mg  20 mg Oral Q6H PRN Nanine Means, NP      . FLUoxetine (PROZAC) capsule 20 mg  20 mg Oral Daily Himabindu Ravi, MD   20 mg at 06/13/12 0825  . gabapentin (NEURONTIN) capsule 400 mg  400 mg Oral TID Verne Spurr, PA-C      . hydrOXYzine (ATARAX/VISTARIL) tablet 25 mg  25 mg Oral Q6H PRN Nanine Means, NP   25 mg at 06/12/12 2248  . loperamide (IMODIUM) capsule 2-4 mg  2-4 mg Oral PRN Nanine Means, NP      . magnesium hydroxide (MILK OF MAGNESIA) suspension 30 mL  30 mL Oral Daily PRN Nanine Means, NP      . methocarbamol (ROBAXIN) tablet 500 mg  500 mg Oral Q8H PRN Nanine Means, NP   500 mg at 06/13/12 1251  . naproxen (NAPROSYN) tablet 500 mg  500 mg Oral  BID PRN Nanine Means, NP   500 mg at 06/13/12 0830  . ondansetron (ZOFRAN-ODT) disintegrating tablet 4 mg  4 mg Oral Q6H PRN Nanine Means, NP      . [DISCONTINUED] FLUoxetine (PROZAC) capsule 10 mg  10 mg Oral Daily Himabindu Ravi, MD   10 mg at 06/12/12 9604    Lab Results: No results found for this or any previous visit (from the past 48 hour(s)).  Physical Findings: AIMS: Facial and Oral Movements Muscles of Facial Expression: None, normal Lips and Perioral Area: None, normal Jaw: None, normal Tongue: None, normal,Extremity Movements Upper (arms, wrists, hands, fingers): None, normal Lower (legs, knees, ankles, toes): None, normal, Trunk Movements Neck, shoulders, hips: None, normal, Overall Severity Severity of abnormal movements (highest score from questions above): None, normal Incapacitation due to abnormal movements: None, normal Patient's awareness of abnormal movements (rate only patient's  report): No Awareness, Dental Status Current problems with teeth and/or dentures?: No Does patient usually wear dentures?: No  CIWA:  CIWA-Ar Total: 0  COWS:     Treatment Plan Summary: Daily contact with patient to assess and evaluate symptoms and progress in treatment Medication management Plan: 1. Continue the current plan of care as written with plans to discharge early on Monday.  Rona Ravens. Tyric Rodeheaver PAC 06/13/2012, 1:22 PM

## 2012-06-13 NOTE — Progress Notes (Signed)
D patient states she slept fair last nite but doesn't think the meds worked for her, appetite is good, energy level is normal and depressed 1/10 and hopeless 0/10 today, denies Si or Hi but does complain of pain in right arm when nerve surgery needs to take place.taking meds as ordered by MD and eating meals in the DR, attending group and participating somewhat but quiet most of times A q20min safety checks continue and support offered, continue to encourage group participation, depressed and flat R patient remains safe on the unit

## 2012-06-13 NOTE — Progress Notes (Signed)
Goals Group Note  Date:  06/13/2012 Time:  0900  Group Topic/Focus:  Goals Group:   The focus of this group is to help patients establish daily goals to achieve during treatment and discuss how the patient can incorporate goal setting into their daily lives to aide in recovery.  Participation Level: Did Not Attend  Participation Quality:  Not Applicable  Affect:  Not Applicable  Cognitive:  Not Applicable  Insight:  Not Applicable  Engagement in Group: Not Applicable  Additional Comments:    Rich Brave 06/13/2012, 1:13 PM

## 2012-06-14 MED ORDER — TRAZODONE HCL 50 MG PO TABS
50.0000 mg | ORAL_TABLET | Freq: Every day | ORAL | Status: DC
  Administered 2012-06-14: 50 mg via ORAL
  Filled 2012-06-14 (×3): qty 1

## 2012-06-14 NOTE — Clinical Social Work Note (Signed)
[facility] Group Notes:  (Clinical Social Work)  06/14/2012   3:00-4:00PM  Summary of Progress/Problems:   The main focus of today's process group was for the patient to define "support" and describe what healthy supports are, then to identify the patient's current support system and decide on other supports that can be put in place to prevent future hospitalizations.  An emphasis was placed on using therapist, doctor and problem-specific support groups to expand supports. The patient expressed that her grandfather and grandmother are her supports.  Later, the patient was tearful and said this was due to thinking about family.  Type of Therapy:  Group Therapy  Participation Level:  Minimal  Participation Quality:  Attentive  Affect:  Depressed  Cognitive:  Oriented  Insight:  Good  Engagement in Group:  Good  Engagement in Therapy:  Good  Modes of Intervention:  Clarification, Education, Limit-setting, Problem-solving, Socialization, Support and Processing   Ambrose Mantle, LCSW 06/14/2012,

## 2012-06-14 NOTE — Progress Notes (Signed)
Patient ID: [name], female   DOB: 1981-06-11, 31 y.o.   MRN: [id number] [facility] MD Progress Note  06/14/2012 1:14 PM [name]  MRN:  [id number]  S: "My mood is good today. I am in this hospital because I took too many Xanax pills. I was just trying to sleep. I was not trying to kill myself. I live with my sister because my husband was locked up since April of this year. I feel overwhelmed having to raise my child alone. I have good support system but I still miss my husband. He will be locked up till 2015. I am used to taken Xanax for sleep, only that time, I too many of them. I took 4 pills instead of I. I am not sleeping well here, but my mood is better".  Negative for fever.  HENT: Negative for congestion and rhinorrhea.  Respiratory: Negative for cough, chest tightness and shortness of breath.  Cardiovascular: Negative for chest pain.  Gastrointestinal: Negative for nausea, vomiting and abdominal pain.  Skin: Negative for rash.  Neurological: Negative for weakness and headaches   Diagnosis:  Assessment:  AXIS I: Depressive Disorder NOS  AXIS II: No diagnosis  AXIS III:  Past Medical History   Diagnosis  Date   .  Pinworms    .  Depression    .  Pituitary tumor     AXIS IV: problems related to legal system/crime  AXIS V: 51-60 moderate symptoms   ADL's:  Intact  Sleep: Fair  Appetite:  Fair  Suicidal Ideation:  Denies Homicidal Ideation:  denies  AEB (as evidenced by): Subjective: Met with Ciji 1:1 to discuss her situation. She states she is doing well with the exception of her elbow where she has a pinched nerve. Mental Status Examination/Evaluation: Objective:  Appearance: Casual  Eye Contact::  Good  Speech:  Clear and Coherent  Volume:  Normal  Mood:  "My mood is good"  Affect:  Congruent with mood.  Thought Process:  Goal Directed  Orientation:  Full  Thought Content:  Denies hallucinations.  Suicidal Thoughts:  No  Homicidal Thoughts:   No  Memory:  Immediate;   Good  Judgement:  Intact  Insight:  Lacking  Psychomotor Activity:  Normal  Concentration:  Fair  Recall:  Fair  Akathisia:  No  Handed:  Right  AIMS (if indicated):     Assets:  Communication Skills Desire for Improvement Housing  Sleep:  Number of Hours: 6.75    Vital Signs:Blood pressure 109/73, pulse 81, temperature 97.3 F (36.3 C), temperature source Oral, resp. rate 16, height 5\' 2"  (1.575 m), weight 60.782 kg (134 lb), last menstrual period 05/22/2012. Current Medications: Current Facility-Administered Medications  Medication Dose Route Frequency Provider Last Rate Last Dose  . acetaminophen (TYLENOL) tablet 650 mg  650 mg Oral Q6H PRN Nanine Means, NP      . alum & mag hydroxide-simeth (MAALOX/MYLANTA) 200-200-20 MG/5ML suspension 30 mL  30 mL Oral Q4H PRN Nanine Means, NP      . chlordiazePOXIDE (LIBRIUM) capsule 25 mg  25 mg Oral QID Nanine Means, NP   25 mg at 06/14/12 1205  . cloNIDine (CATAPRES) tablet 0.1 mg  0.1 mg Oral BH-qamhs Nanine Means, NP       Followed by  . cloNIDine (CATAPRES) tablet 0.1 mg  0.1 mg Oral QAC breakfast Nanine Means, NP      . dicyclomine (BENTYL) tablet 20 mg  20 mg Oral Q6H PRN Nanine Means,  NP      . FLUoxetine (PROZAC) capsule 20 mg  20 mg Oral Daily Himabindu Ravi, MD   20 mg at 06/14/12 0816  . gabapentin (NEURONTIN) capsule 400 mg  400 mg Oral TID Verne Spurr, PA-C   400 mg at 06/14/12 1205  . hydrOXYzine (ATARAX/VISTARIL) tablet 25 mg  25 mg Oral Q6H PRN Nanine Means, NP   25 mg at 06/13/12 2209  . loperamide (IMODIUM) capsule 2-4 mg  2-4 mg Oral PRN Nanine Means, NP      . magnesium hydroxide (MILK OF MAGNESIA) suspension 30 mL  30 mL Oral Daily PRN Nanine Means, NP      . methocarbamol (ROBAXIN) tablet 500 mg  500 mg Oral Q8H PRN Nanine Means, NP   500 mg at 06/14/12 0819  . naproxen (NAPROSYN) tablet 500 mg  500 mg Oral BID PRN Nanine Means, NP   500 mg at 06/13/12 2210  . ondansetron (ZOFRAN-ODT)  disintegrating tablet 4 mg  4 mg Oral Q6H PRN Nanine Means, NP        Lab Results: No results found for this or any previous visit (from the past 48 hour(s)).  Physical Findings: AIMS: Facial and Oral Movements Muscles of Facial Expression: None, normal Lips and Perioral Area: None, normal Jaw: None, normal Tongue: None, normal,Extremity Movements Upper (arms, wrists, hands, fingers): None, normal Lower (legs, knees, ankles, toes): None, normal, Trunk Movements Neck, shoulders, hips: None, normal, Overall Severity Severity of abnormal movements (highest score from questions above): None, normal Incapacitation due to abnormal movements: None, normal Patient's awareness of abnormal movements (rate only patient's report): No Awareness, Dental Status Current problems with teeth and/or dentures?: No Does patient usually wear dentures?: No  CIWA:  CIWA-Ar Total: 0  COWS:     Treatment Plan Summary: Daily contact with patient to assess and evaluate symptoms and progress in treatment Medication management  Plan: 1. See new changes made on the current treatment regimen. Trazodone 50 mg Q bedtime for sleep. Continue current treatment plan.  Sanjuana Kava, PMH-NP 06/14/2012, 1:14 PM

## 2012-06-14 NOTE — Progress Notes (Signed)
Goals Group Note  Date:  06/14/2012 Time:  0900  Group Topic/Focus:  Identifying  Goals: The focus of the group is to help the patients identify goals they want to achieve, to introduce them to their Sunday Patient Workbooks and to help motivate them to take the steps they need to take...influenza order to become healthier individuals.  Participation Level:  Active  Participation Quality: good  Affect: flat  Cognitive: good   Insight:  fair  Engagement in Group: engaged  Additional Comments:   PD RN [facility] [id number]

## 2012-06-14 NOTE — Progress Notes (Signed)
D slept fair last nite requesting different meds for sleep, appetite is good, energy level is normal and ability to pay attention is good, depressed 1/10 and hopeless 0/10 today, denies Si or Hi, continues w/nerve pain in right arm, attending group and going to Dr for meals, taking meds as ordered by MD, interacting w/peers on the unit and is pleasant and coopearative, new med for nerve pain begun. A q79min safety checks continue and support offered, encouraged to continue group and interacting w/peers/staff for her pain management R patient remains safe on the unit

## 2012-06-14 NOTE — Progress Notes (Signed)
Psychoeducational Group Note  Date:  06/14/2012 Time: 1015 Group Topic/Focus:  Making Healthy Choices:   The focus of this group is to help patients identify negative/unhealthy choices they were using prior to admission and identify positive/healthier coping strategies to replace them upon discharge.  Participation Level:  Active  Participation Quality:  Appropriate  Affect:  Depressed  Cognitive:  Alert  Insight:  Good  Engagement in Group:  Good  Additional Comments:    Rich Brave 5:57 PM. 06/14/2012

## 2012-06-15 MED ORDER — GABAPENTIN 400 MG PO CAPS: 400.0000 mg | ORAL_CAPSULE | Freq: Three times a day (TID) | ORAL | Status: DC

## 2012-06-15 MED ORDER — TRAZODONE HCL 50 MG PO TABS: 50.0000 mg | ORAL_TABLET | Freq: Every day | ORAL | Status: DC

## 2012-06-15 MED ORDER — FLUOXETINE HCL 20 MG PO CAPS: 20.0000 mg | ORAL_CAPSULE | Freq: Every day | ORAL | Status: DC

## 2012-06-15 NOTE — Tx Team (Signed)
Interdisciplinary Treatment Plan Update (Adult)  Date:  06/15/2012  Time Reviewed:  9:42 AM   Progress in Treatment: Attending groups:   Yes   Participating in groups:  Yes Taking medication as prescribed:  Yes Tolerating medication:  Yes Family/Significant othe contact made: Contact made with family Patient understands diagnosis:  Yes Discussing patient identified problems/goals with staff: Yes Medical problems stabilized or resolved: Yes Denies suicidal/homicidal ideation:Yes Issues/concerns per patient self-inventory:  Other:   New problem(s) identified:  Reason for Continuation of Hospitalization:  Interventions implemented related to continuation of hospitalization:  Additional comments:  Estimated length of stay:   Discharge Plan:  Home with outpatient follow up  New goal(s):  Review of initial/current patient goals per problem list:    1.  Goal(s): Eliminate SI/other thoughts of self harm   Met:  Yes  Target date: d/c  As evidenced by: Patient no longer endorsing SI/HI or other thoughts of self harm.    2.  Goal (s):Reduce depression/anxiety  Met: Yes  Target date: d/c  As evidenced by: Patient currently rating symptoms at four or below    3.  Goal(s):.stabilize on meds   Met:  Yes  Target date: d/c  As evidenced by: Patient reports being stabilized on medications - less symptomatic    4.  Goal(s): Refer for outpatient follow up   Met:  Yes  Target date: d/c  As evidenced by: Follow up appointment scheduled    Attendees: Patient:   [name] 06/15/2012 9:42 AM  Physican:  Patrick North, MD 06/15/2012 9:42 AM  Nursing:  Neill Loft, RN 06/15/2012 9:42 AM   Nursing:   Chinita Greenland, RN 06/15/2012 9:42 AM   Clinical Social Worker:  Juline Patch, LCSW 06/15/2012 9:42 AM   Other: Tommas Olp, FNP 06/15/2012 9:42 AM   Other: Patton Salles, LCSW Clinical Social Worker   06/15/2012 9:44 AM

## 2012-06-15 NOTE — Progress Notes (Signed)
[facility] Case Management Discharge Plan:  Will you be returning to the same living situation after discharge: Yes,  Patient returning to live with family At discharge, do you have transportation home?:Yes,  Family to transport patient home Do you have the ability to pay for your medications:Yes,  Patient has Medicaid and reports being able to make co-pay  Interagency Information:     Release of information consent forms completed and in the chart;  Patient's signature needed at discharge.  Patient to Follow up at:  Follow-up Information    Follow up with [facility]. On 06/17/2012. (You are to see Evelena Peat on Wednesday, June 17, 2012  as a walk in.  Please call office to determine the best time to come)    Contact information:   [address] [place], [place]  [address]  [phone]      Follow up with Carroll Sage, MD - [facility]. On 06/17/2012. (Please go to Dr. Tiburcio Pea office on Wednesday, Novmeber 27, 2013 as a walk in.  Please call the office for the best time to come)    Contact information:   [address] [place], [place]  [address]  [phone]         Patient denies SI/HI:   Yes,  Patient no longer endorsing SI/HI or other thoughts of self harm    Safety Planning and Suicide Prevention discussed:  Yes,  Reviewed during aftercare group  Barrier to discharge identified:Yes,  Legal problems  Summary and Recommendations: Patient encouraged to be compliant with medications and to follow up with all outpatient recommendations.    Wynn Banker 06/15/2012, 3:57 PM

## 2012-06-15 NOTE — Discharge Summary (Signed)
Physician Discharge Summary Note  Patient:  [name] is an 31 y.o., female MRN:  [id number] DOB:  1981-05-03 Patient phone:  [phone] (home)  Patient address:   [address] [place] [address],   Date of Admission:  06/10/2012  Date of Discharge: 06/15/12  Reason for Admission: Suicidal gesture by overdose  Discharge Diagnoses: Principal Problem:  *MDD (major depressive disorder)   Axis Diagnosis:   AXIS I:  MDD (major depressive disorder) AXIS II:  Deferred AXIS III:   Past Medical History  Diagnosis Date  . Pinworms   . Depression   . Pituitary tumor    AXIS IV:  other psychosocial or environmental problems AXIS V:  65  Level of Care:  OP  Hospital Course: Patient is a 31 year old female who today says she took some extra medicine due to a situation in her life. She denies trying to overdose. However per the [facility] assessment, Patient reports that she took a hand full of her prescribed medication yesterday due to overwhelming feelings of hopelessness and anxiety associated with her husband going to jail in April 2013. Patient also reported` that she attempted to hurt herself in the past by taking an overdose of medication but she never come to the emergency room for help. Patient reports that, "I know that I need help".  Patient reports increased depressive episodes consisting of crying un-controbally and experiencing irrational thoughts of wanting to take some medication and just go to sleep because she is not able to manage her level of stress and anxiety.  Patient reports that she is currently seeking a psychiatrist for medication management in order to decrease her depression and anxiety. Patient reports that she has been taking Sabixon, Xanax and Adderall from her psychiatrist (Dr. Tiburcio Pea [phone]) in [place]. Patient reports that she has been seeing this psychiatrist for 6 months. Patient reports that she has just begun meeting with a therapist.  Patient reports that she continues to experiences depressive episodes when she is taking her medication as prescribed.    While a patient in this hospital, Ms. Nesheim received both Clonidine and Librium protocols for Benzodiazepine and Amphetamine detoxifications. She also received medication management for her depressive mood/anxiety symptoms. She was ordered and received Prozac 20 mg for depression, Neurontin 400 mg for anxiety and Trazodone 50 mg for sleep. She was also enrolled in group counseling sessions and activities in which she participated actively on daily basis. As her treatment regimen progressed, it was determined that patient is responding well to her treatment plan. This is evidenced by her daily reports of improved mood, reduction of symptoms and presentation of good affects and eye contact.   Besides treatment for her depressive mood, Ms. Tonne also received monitoring for any other medical issues and concerns she may have complained about and or reported. She tolerated her treatment regimen without any adverse effects and or reactions reported.  Patient attended treatment team meeting this a.m and met with the treatment team members. Her reason for admission, treatment plans, response to treatment and discharge plans discussed.  Ms. Mannor endorsed that her symptoms have improved. Pt also stated that she is stable for discharge to pursue the next phase of her psychiatric care.  It was agreed upon that patient will continue psychiatric care on outpatient basis to maintain stability. She will follow-up care at the [facility] on 06/17/12. She will will be meeting with Dr. Carroll Sage for medication management and with Evelena Peat  for counseling sessions. Patient is instructed that this is a Walk-in appointment and should call the clinic prior to making these visits.  This is to assure that she will be given appropriate times to come in for these visits since  these are walk-in visits. The address, date and time for this appointment provided for patient.  As to what patient learned from being in this hospital, she reported that from this hospital stay she had learned to take care of her self by staying on her medications, report any possible adverse effects to her outpatient provider and keep her appointments as scheduled with her psychiatrist.   Upon discharge, patient adamantly denies suicidal, homicidal ideations, auditory, visual hallucinations and or delusional thinking. She left [facility] with all personal belongings via personal transportation in no apparent distress.  Consults:  None  Significant Diagnostic Studies:  labs: CBC with diff, CMP, UDS, Toxicology tests.  Discharge Vitals:   Blood pressure 91/61, pulse 87, temperature 97.6 F (36.4 C), temperature source Oral, resp. rate 16, height 5\' 2"  (1.575 m), weight 60.782 kg (134 lb), last menstrual period 05/22/2012. Lab Results:   No results found for this or any previous visit (from the past 72 hour(s)).  Physical Findings: AIMS: Facial and Oral Movements Muscles of Facial Expression: None, normal Lips and Perioral Area: None, normal Jaw: None, normal Tongue: None, normal,Extremity Movements Upper (arms, wrists, hands, fingers): None, normal Lower (legs, knees, ankles, toes): None, normal, Trunk Movements Neck, shoulders, hips: None, normal, Overall Severity Severity of abnormal movements (highest score from questions above): None, normal Incapacitation due to abnormal movements: None, normal Patient's awareness of abnormal movements (rate only patient's report): No Awareness, Dental Status Current problems with teeth and/or dentures?: No Does patient usually wear dentures?: No  CIWA:  CIWA-Ar Total: 4  COWS:     Mental Status Exam: See Mental Status Examination and Suicide Risk Assessment completed by Attending Physician prior to discharge.  Discharge destination:  Home  Is  patient on multiple antipsychotic therapies at discharge:  No   Has Patient had three or more failed trials of antipsychotic monotherapy by history:  No  Recommended Plan for Multiple Antipsychotic Therapies: NA     Medication List     As of 06/15/2012 11:31 AM    STOP taking these medications         ALPRAZolam 1 MG tablet   Commonly known as: XANAX      amphetamine-dextroamphetamine 30 MG 24 hr capsule   Commonly known as: ADDERALL XR      SUBOXONE 8-2 MG Film   Generic drug: Buprenorphine HCl-Naloxone HCl      TAKE these medications      Indication    FLUoxetine 20 MG capsule   Commonly known as: PROZAC   Take 1 capsule (20 mg total) by mouth daily. For depression       gabapentin 400 MG capsule   Commonly known as: NEURONTIN   Take 1 capsule (400 mg total) by mouth 3 (three) times daily. For anxiety       traZODone 50 MG tablet   Commonly known as: DESYREL   Take 1 tablet (50 mg total) by mouth at bedtime. For sleep            Follow-up Information    Follow up with [facility]. On 06/17/2012. (You are to see Evelena Peat on Wednesday, June 17, 2012  as a walk in.  Please call office to  determine the best time to come)    Contact information:   [address] [place], [place]  [address]  [phone]      Follow up with Carroll Sage, MD - [facility]. On 06/17/2012. (Please go to Dr. Tiburcio Pea office on Wednesday, Novmeber 27, 2013 as a walk in.  Please call the office for the best time to come)    Contact information:   [address] [place], [place]  [address]  [phone]         Follow-up recommendations:  Activity:  as tolerated Other:  Keep all scheduled follow-up appointments as recommended.    Comments:  Take all your medications as prescribed by your mental healthcare provider. Report any adverse effects and or reactions from your medicines to your outpatient provider  promptly. Patient is instructed and cautioned to not engage in alcohol and or illegal drug use while on prescription medicines. In the event of worsening symptoms, patient is instructed to call the crisis hotline, 911 and or go to the nearest ED for appropriate evaluation and treatment of symptoms. Follow-up with your primary care provider for your other medical issues, concerns and or health care needs.     SignedArmandina Stammer I 06/15/2012, 11:31 AM

## 2012-06-15 NOTE — [facility] Suicide Risk Assessment (Signed)
Suicide Risk Assessment  Discharge Assessment     Demographic Factors:  Female, married  Mental Status Per Nursing Assessment::   On Admission:     Current Mental Status by Physician: Patient alert and oriented to 4. Mood significantly improved. Denies AH/VH/SI/HI.  Loss Factors: Legal issues  Historical Factors: Impulsivity  Risk Reduction Factors:   Living with another person, especially a relative and Positive coping skills or problem solving skills  Continued Clinical Symptoms:  Depression:   Recent sense of peace/wellbeing  Cognitive Features That Contribute To Risk:  Cognitively intact.  Suicide Risk:  Minimal: No identifiable suicidal ideation.  Patients presenting with no risk factors but with morbid ruminations; may be classified as minimal risk based on the severity of the depressive symptoms  Discharge Diagnoses:   AXIS I:  Depressive Disorder NOS AXIS II:  No diagnosis AXIS III:   Past Medical History  Diagnosis Date  . Pinworms   . Depression   . Pituitary tumor    AXIS IV:  problems related to legal system/crime AXIS V:  61-70 mild symptoms  Plan Of Care/Follow-up recommendations:  Activity:  normal Diet:  normal Follow up with outpatient appointments.  Is patient on multiple antipsychotic therapies at discharge:  No   Has Patient had three or more failed trials of antipsychotic monotherapy by history:  No  Recommended Plan for Multiple Antipsychotic Therapies: NA   Nathifa Ritthaler 06/15/2012, 10:00 AM

## 2012-06-15 NOTE — Progress Notes (Signed)
Psychoeducational Group Note  Date:  06/15/2012 Time:  1100  Group Topic/Focus:  Self Care:   The focus of this group is to help patients understand the importance of self-care in order to improve or restore emotional, physical, spiritual, interpersonal, and financial health.  Participation Level: Did Not Attend  Participation Quality:  Not Applicable  Affect:  Not Applicable  Cognitive:  Not Applicable  Insight:  Not Applicable  Engagement in Group: Not Applicable  Additional Comments:  Patient did not attend group due to process of discharge. Karleen Hampshire Brittini 06/15/2012, 2:56 PM

## 2012-06-15 NOTE — Progress Notes (Signed)
Patient ID: [name], female   DOB: [birth year], 31 y.o.   MRN: [id number] Patient was discharged ambulatory to ride home with her grandparents.  She denies SI/HI.  She verbalizes understanding of her d/c meds and followup.  She was given scripts.  She is planning to have her arm operated on tomorrow.  She is optimistic.

## 2012-06-15 NOTE — Progress Notes (Signed)
D: Pt resting in bed, eyes closed, respirations even and unlabored.   A: Will continue to monitor.  R: In no apparent distress. Careli Luzader RN 

## 2012-06-16 NOTE — Progress Notes (Signed)
Patient Discharge Instructions:  After Visit Summary (AVS):   Faxed to:  06/16/12 Psychiatric Admission Assessment Note:   Faxed to:  06/16/12 Suicide Risk Assessment - Discharge Assessment:   Faxed to:  06/16/12 Faxed/Sent to the Next Level Care provider:  06/16/12 Faxed to [facility] @ [phone]  Jerelene Redden, 06/16/2012, 3:50 PM

## 2012-06-23 NOTE — Discharge Summary (Signed)
Reviewed

## 2013-04-16 ENCOUNTER — Emergency Department (HOSPITAL_COMMUNITY)
Admission: EM | Admit: 2013-04-16 | Discharge: 2013-04-16 | Disposition: A | Discharge status: Home / Self Care | Payer: Medicaid Other | Attending: Emergency Medicine | Admitting: Emergency Medicine

## 2013-04-16 ENCOUNTER — Encounter (HOSPITAL_COMMUNITY): Payer: Self-pay | Admitting: *Deleted

## 2013-04-16 DIAGNOSIS — F3289 Other specified depressive episodes: Secondary | ICD-10-CM | POA: Insufficient documentation

## 2013-04-16 DIAGNOSIS — Z79899 Other long term (current) drug therapy: Secondary | ICD-10-CM | POA: Insufficient documentation

## 2013-04-16 DIAGNOSIS — Z3202 Encounter for pregnancy test, result negative: Secondary | ICD-10-CM | POA: Insufficient documentation

## 2013-04-16 DIAGNOSIS — Z8619 Personal history of other infectious and parasitic diseases: Secondary | ICD-10-CM | POA: Insufficient documentation

## 2013-04-16 DIAGNOSIS — F329 Major depressive disorder, single episode, unspecified: Secondary | ICD-10-CM | POA: Insufficient documentation

## 2013-04-16 DIAGNOSIS — N760 Acute vaginitis: Secondary | ICD-10-CM | POA: Insufficient documentation

## 2013-04-16 DIAGNOSIS — Z8669 Personal history of other diseases of the nervous system and sense organs: Secondary | ICD-10-CM | POA: Insufficient documentation

## 2013-04-16 DIAGNOSIS — R110 Nausea: Secondary | ICD-10-CM | POA: Insufficient documentation

## 2013-04-16 DIAGNOSIS — N390 Urinary tract infection, site not specified: Secondary | ICD-10-CM | POA: Insufficient documentation

## 2013-04-16 DIAGNOSIS — B9689 Other specified bacterial agents as the cause of diseases classified elsewhere: Secondary | ICD-10-CM

## 2013-04-16 LAB — PREGNANCY, URINE: Preg Test, Ur: NEGATIVE

## 2013-04-16 LAB — URINE MICROSCOPIC-ADD ON

## 2013-04-16 LAB — URINALYSIS, ROUTINE W REFLEX MICROSCOPIC
Bilirubin Urine: NEGATIVE
Nitrite: POSITIVE — AB
Specific Gravity, Urine: 1.025 (ref 1.005–1.030)
pH: 6.5 (ref 5.0–8.0)

## 2013-04-16 MED ORDER — CEPHALEXIN 500 MG PO CAPS: 500.0000 mg | ORAL_CAPSULE | Freq: Four times a day (QID) | ORAL | Status: DC

## 2013-04-16 MED ORDER — METRONIDAZOLE 500 MG PO TABS: 500.0000 mg | ORAL_TABLET | Freq: Two times a day (BID) | ORAL | Status: DC

## 2013-04-16 NOTE — ED Notes (Signed)
Pt presents with lower abdominal pain x 3 days with worsening pain through out the day. Pt also reports increasing grayish vaginal discharge with strong odor. Pt denies multiple sex partners and fever. Abdominal cramping worsening throughout the exam. NAD noted at this time. Denies flank pain,n/v at this time.

## 2013-04-16 NOTE — ED Notes (Signed)
Pt c/o lower abdominal pain, lower back pain, vaginal odor, and vaginal spotting x 2-3 weeks. Denies dysuria.

## 2013-04-16 NOTE — ED Provider Notes (Signed)
CSN: [id number]     Arrival date & time 04/16/13  2006 History  This chart was scribed for Glynn Octave, MD by Bennett Scrape, ED Scribe. This patient was seen in room APA12/APA12 and the patient's care was started at 8:43 PM.   Chief Complaint  Patient presents with  . Abdominal Pain  . Back Pain  . vaginal odor   . Vaginal Bleeding    The history is provided by the patient. No language interpreter was used.   HPI Comments: [name] is a 32 y.o. female who presents to the Emergency Department complaining of intermittent suprapubic abdominal pain that radiates to the low back with associated nausea and vaginal bleeding describes as spotting that started around 2 weeks ago. She states that she became concerned when she developed vaginal odor within the past few days. She reports only one sexual partner that she has been in a monogamous relationship for "a while". She denies dysuria, vaginal discharge and hematuria as associated symptoms. She reports that her LNMP was September 5th, 2015 but last only 2 days, shorter than her usual menses. She has a h/o appendectomy but denies any other surgeries.   Past Medical History  Diagnosis Date  . Pinworms   . Depression   . Pituitary tumor    Past Surgical History  Procedure Laterality Date  . Cesarean section      x2  . Appendectomy    . Pinched nerve rt arm.    . Ectopic pregnancy surgery    . Tubal ligation     History reviewed. No pertinent family history. History  Substance Use Topics  . Smoking status: Never Smoker   . Smokeless tobacco: Not on file  . Alcohol Use: No   No OB history provided.  Review of Systems  A complete 10 system review of systems was obtained and all systems are negative except as noted in the HPI and PMH.   Allergies  Hydrocodone  Home Medications   Current Outpatient Rx  Name  Route  Sig  Dispense  Refill  . ALPRAZolam (XANAX) 1 MG tablet   Oral   Take 1 mg by mouth 2 (two) times  daily.         . cephALEXin (KEFLEX) 500 MG capsule   Oral   Take 1 capsule (500 mg total) by mouth 4 (four) times daily.   40 capsule   0   . metroNIDAZOLE (FLAGYL) 500 MG tablet   Oral   Take 1 tablet (500 mg total) by mouth 2 (two) times daily.   14 tablet   0    Triage Vitals: BP 111/66  Pulse 87  Temp(Src) 98.1 F (36.7 C) (Oral)  Resp 20  Ht 5\' 1"  (1.549 m)  Wt 125 lb (56.7 kg)  BMI 23.63 kg/m2  SpO2 100%  LMP 03/26/2013  Physical Exam  Nursing note and vitals reviewed. Constitutional: She is oriented to person, place, and time. She appears well-developed and well-nourished. No distress.  HENT:  Head: Normocephalic and atraumatic.  Eyes: Conjunctivae and EOM are normal.  Neck: Normal range of motion. Neck supple. No tracheal deviation present.  Cardiovascular: Normal rate, regular rhythm and normal heart sounds.   No murmur heard. Pulmonary/Chest: Effort normal and breath sounds normal. No respiratory distress. She has no wheezes. She has no rales.  Abdominal: Soft. Bowel sounds are normal. There is tenderness (suprapuibic tenderness). There is no rebound and no guarding.  No CVA tenderness  Genitourinary: No vaginal  discharge found.  Normal external genitalia. Brown discharge from cervix. No CMT. No adnexal tenderness.  Musculoskeletal: Normal range of motion. She exhibits no edema.  Bilateral paraspinal lumbar tenderness  Neurological: She is alert and oriented to person, place, and time. No cranial nerve deficit.  Skin: Skin is warm and dry.  Psychiatric: She has a normal mood and affect. Her behavior is normal.    ED Course  Procedures (including critical care time)  DIAGNOSTIC STUDIES: Oxygen Saturation is 100% on room air, normal by my interpretation.    COORDINATION OF CARE: 8:48 PM-Discussed treatment plan which includes pelvic exam and UA with pt at bedside and pt agreed to plan.   9:54 PM-Informed pt of radiology and lab work results showing  bacterial vaginosis and UTI. Discussed discharge plan which includes antibiotics with pt and pt agreed to plan. Also advised pt to follow up as needed and pt agreed. Addressed symptoms to return for with pt.   Labs Review Labs Reviewed  WET PREP, GENITAL - Abnormal; Notable for the following:    Clue Cells Wet Prep HPF POC MODERATE (*)    WBC, Wet Prep HPF POC FEW (*)    All other components within normal limits  URINALYSIS, ROUTINE W REFLEX MICROSCOPIC - Abnormal; Notable for the following:    APPearance HAZY (*)    Hgb urine dipstick MODERATE (*)    Ketones, ur TRACE (*)    Protein, ur 30 (*)    Nitrite POSITIVE (*)    Leukocytes, UA TRACE (*)    All other components within normal limits  URINE MICROSCOPIC-ADD ON - Abnormal; Notable for the following:    Squamous Epithelial / LPF MANY (*)    Bacteria, UA MANY (*)    All other components within normal limits  GC/CHLAMYDIA PROBE AMP  URINE CULTURE  PREGNANCY, URINE   Imaging Review No results found.  MDM   1. Bacterial vaginosis   2. Urinary tract infection    2 weeks of lower abdominal pain, low back pain, vaginal odor and spotting. Last menstrual period September 5. No dysuria hematuria.  Abdomen soft without peritoneal signs. HCG negative  Pelvic exam benign. Patient will be treated for a urinary tract infection and bacterial vaginosis. Tolerating by mouth in ED. No vomiting.  I personally performed the services described in this documentation, which was scribed in my presence. The recorded information has been reviewed and is accurate.    Glynn Octave, MD 04/16/13 2232

## 2013-04-18 LAB — GC/CHLAMYDIA PROBE AMP
CT Probe RNA: NEGATIVE
GC Probe RNA: NEGATIVE

## 2013-04-20 LAB — URINE CULTURE: Colony Count: >=100000

## 2013-10-03 ENCOUNTER — Encounter (HOSPITAL_COMMUNITY): Payer: Self-pay | Admitting: Emergency Medicine

## 2013-10-03 ENCOUNTER — Emergency Department (HOSPITAL_COMMUNITY)
Admission: EM | Admit: 2013-10-03 | Discharge: 2013-10-05 | Disposition: A | Discharge status: Other Acute Inpatient Hospital | Payer: 59 | Attending: Emergency Medicine | Admitting: Emergency Medicine

## 2013-10-03 DIAGNOSIS — F1910 Other psychoactive substance abuse, uncomplicated: Secondary | ICD-10-CM

## 2013-10-03 DIAGNOSIS — F1110 Opioid abuse, uncomplicated: Secondary | ICD-10-CM | POA: Insufficient documentation

## 2013-10-03 DIAGNOSIS — F329 Major depressive disorder, single episode, unspecified: Secondary | ICD-10-CM | POA: Insufficient documentation

## 2013-10-03 DIAGNOSIS — F1310 Sedative, hypnotic or anxiolytic abuse, uncomplicated: Secondary | ICD-10-CM | POA: Insufficient documentation

## 2013-10-03 DIAGNOSIS — Z3202 Encounter for pregnancy test, result negative: Secondary | ICD-10-CM | POA: Insufficient documentation

## 2013-10-03 DIAGNOSIS — F3289 Other specified depressive episodes: Secondary | ICD-10-CM | POA: Insufficient documentation

## 2013-10-03 DIAGNOSIS — F32A Depression, unspecified: Secondary | ICD-10-CM

## 2013-10-03 DIAGNOSIS — Z862 Personal history of diseases of the blood and blood-forming organs and certain disorders involving the immune mechanism: Secondary | ICD-10-CM | POA: Insufficient documentation

## 2013-10-03 DIAGNOSIS — Z8619 Personal history of other infectious and parasitic diseases: Secondary | ICD-10-CM | POA: Insufficient documentation

## 2013-10-03 DIAGNOSIS — F1410 Cocaine abuse, uncomplicated: Secondary | ICD-10-CM | POA: Insufficient documentation

## 2013-10-03 DIAGNOSIS — Z8639 Personal history of other endocrine, nutritional and metabolic disease: Secondary | ICD-10-CM | POA: Insufficient documentation

## 2013-10-03 DIAGNOSIS — Z79899 Other long term (current) drug therapy: Secondary | ICD-10-CM | POA: Insufficient documentation

## 2013-10-03 NOTE — ED Notes (Signed)
Patient presents tonight stating she wants detox from pain pills.  Denies SI/HI

## 2013-10-04 LAB — COMPREHENSIVE METABOLIC PANEL
ALT: 39 U/L — AB (ref 0–35)
AST: 28 U/L (ref 0–37)
Albumin: 4 g/dL (ref 3.5–5.2)
Alkaline Phosphatase: 65 U/L (ref 39–117)
BUN: 15 mg/dL (ref 6–23)
CALCIUM: 9.6 mg/dL (ref 8.4–10.5)
CO2: 29 mEq/L (ref 19–32)
CREATININE: 0.83 mg/dL (ref 0.50–1.10)
Chloride: 103 mEq/L (ref 96–112)
GFR calc Af Amer: >90 mL/min (ref >90)
GLUCOSE: 121 mg/dL — AB (ref 70–99)
Potassium: 3.9 mEq/L (ref 3.7–5.3)
SODIUM: 143 meq/L (ref 137–147)
TOTAL PROTEIN: 8 g/dL (ref 6.0–8.3)
Total Bilirubin: 0.3 mg/dL (ref 0.3–1.2)

## 2013-10-04 LAB — CBC WITH DIFFERENTIAL/PLATELET
Basophils Absolute: 0.1 10*3/uL (ref 0.0–0.1)
Basophils Relative: 1 % (ref 0–1)
EOS ABS: 0.4 10*3/uL (ref 0.0–0.7)
EOS PCT: 5 % (ref 0–5)
HEMATOCRIT: 38.2 % (ref 36.0–46.0)
Hemoglobin: 13.2 g/dL (ref 12.0–15.0)
LYMPHS ABS: 2.9 10*3/uL (ref 0.7–4.0)
Lymphocytes Relative: 40 % (ref 12–46)
MCH: 30.8 pg (ref 26.0–34.0)
MCHC: 34.6 g/dL (ref 30.0–36.0)
MCV: 89 fL (ref 78.0–100.0)
MONO ABS: 0.5 10*3/uL (ref 0.1–1.0)
Monocytes Relative: 6 % (ref 3–12)
Neutro Abs: 3.5 10*3/uL (ref 1.7–7.7)
Neutrophils Relative %: 48 % (ref 43–77)
PLATELETS: 355 10*3/uL (ref 150–400)
RBC: 4.29 MIL/uL (ref 3.87–5.11)
RDW: 12.2 % (ref 11.5–15.5)
WBC: 7.3 10*3/uL (ref 4.0–10.5)

## 2013-10-04 LAB — RAPID URINE DRUG SCREEN, HOSP PERFORMED
AMPHETAMINES: NOT DETECTED
BENZODIAZEPINES: POSITIVE — AB
Barbiturates: NOT DETECTED
COCAINE: POSITIVE — AB
OPIATES: POSITIVE — AB
Tetrahydrocannabinol: NOT DETECTED

## 2013-10-04 LAB — ETHANOL: Alcohol, Ethyl (B): <11 mg/dL (ref 0–11)

## 2013-10-04 LAB — PREGNANCY, URINE: Preg Test, Ur: NEGATIVE

## 2013-10-04 MED ORDER — GABAPENTIN 300 MG PO CAPS
300.0000 mg | ORAL_CAPSULE | Freq: Three times a day (TID) | ORAL | Status: DC
  Administered 2013-10-04 (×3): 300 mg via ORAL
  Filled 2013-10-04 (×7): qty 1

## 2013-10-04 MED ORDER — LORAZEPAM 1 MG PO TABS
1.0000 mg | ORAL_TABLET | Freq: Three times a day (TID) | ORAL | Status: DC | PRN
  Administered 2013-10-04 (×3): 1 mg via ORAL
  Filled 2013-10-04 (×3): qty 1

## 2013-10-04 MED ORDER — ALUM & MAG HYDROXIDE-SIMETH 200-200-20 MG/5ML PO SUSP
30.0000 mL | ORAL | Status: DC | PRN
  Administered 2013-10-04: 30 mL via ORAL
  Filled 2013-10-04: qty 30

## 2013-10-04 MED ORDER — QUETIAPINE FUMARATE 50 MG PO TABS
75.0000 mg | ORAL_TABLET | Freq: Every day | ORAL | Status: DC
  Filled 2013-10-04: qty 1

## 2013-10-04 MED ORDER — AMPHETAMINE-DEXTROAMPHETAMINE 10 MG PO TABS
30.0000 mg | ORAL_TABLET | Freq: Two times a day (BID) | ORAL | Status: DC
  Administered 2013-10-04 (×2): 30 mg via ORAL
  Filled 2013-10-04 (×2): qty 3

## 2013-10-04 MED ORDER — QUETIAPINE FUMARATE 25 MG PO TABS
75.0000 mg | ORAL_TABLET | Freq: Every day | ORAL | Status: DC
  Administered 2013-10-04: 75 mg via ORAL
  Filled 2013-10-04 (×2): qty 3

## 2013-10-04 MED ORDER — ONDANSETRON HCL 4 MG PO TABS
4.0000 mg | ORAL_TABLET | Freq: Three times a day (TID) | ORAL | Status: DC | PRN
  Administered 2013-10-04: 4 mg via ORAL
  Filled 2013-10-04: qty 1

## 2013-10-04 NOTE — BH Assessment (Signed)
Assessment complete. Per Lavell Luster, [facility], adult unit is at capacity. Consulted with Serena Colonel, NP who agrees Pt meets criteria for inpatient treatment. TTS will contact other facilities for bed availability. Notified Dr. Shanon Rosser of recommendation.  Orpah Greek Rosana Hoes, [facility] Triage Specialist

## 2013-10-04 NOTE — BH Assessment (Signed)
Received call for assessment. Spoke to Dr. Shanon Rosser who said Pt is abusing narcotics, has good insight and is requesting treatment so she can get her life back. Tele-assessment will be initiated.  Orpah Greek Rosana Hoes, [facility] Triage Specialist

## 2013-10-04 NOTE — ED Notes (Signed)
Tele-psych consult in process.

## 2013-10-04 NOTE — ED Provider Notes (Signed)
CSN: [id number]     Arrival date & time 10/03/13  2326 History   First MD Initiated Contact with Patient 10/04/13 0103     Chief Complaint  Patient presents with  . Wants Detox      (Consider location/radiation/quality/duration/timing/severity/associated sxs/prior Treatment) HPI This is a 33 year old female with a long-standing history of substance abuse, notably narcotics. She had been on Suboxone therapy but has not been able to see her psychiatrist because Medicaid won't pay for this. She has subsequently been out of Suboxone for several months and has been using Vicodin, Percocet and Suboxone that she buys on the street. Apart from Xanax she no longer has any of her prior psych medications. She was on Adderall to help her stay awake and active during the day. As a result of being out of Adderall she is very somnolent, depressed with poor appetite and poor motivation. She denies suicidal or homicidal ideation. She wants to get off of street drugs and back on her Suboxone and regular psychiatric medications. One of the motivators of this is that her daughter was taken away from her and she wishes to be reunited with her daughter.  She admits to abusing cocaine in addition to street opiates. She denies IV drug use.  Past Medical History  Diagnosis Date  . Pinworms   . Depression   . Pituitary tumor    Past Surgical History  Procedure Laterality Date  . Cesarean section      x2  . Appendectomy    . Pinched nerve rt arm.    . Ectopic pregnancy surgery    . Tubal ligation     No family history on file. History  Substance Use Topics  . Smoking status: Never Smoker   . Smokeless tobacco: Not on file  . Alcohol Use: No   OB History   Grav Para Term Preterm Abortions TAB SAB Ect Mult Living                 Review of Systems  All other systems reviewed and are negative.      Allergies  Hydrocodone  Home Medications   Current Outpatient Rx  Name  Route  Sig  Dispense   Refill  . ALPRAZolam (XANAX) 1 MG tablet   Oral   Take 1 mg by mouth 2 (two) times daily.         . cephALEXin (KEFLEX) 500 MG capsule   Oral   Take 1 capsule (500 mg total) by mouth 4 (four) times daily.   40 capsule   0   . metroNIDAZOLE (FLAGYL) 500 MG tablet   Oral   Take 1 tablet (500 mg total) by mouth 2 (two) times daily.   14 tablet   0    BP 114/87  Pulse 98  Temp(Src) 98.3 F (36.8 C) (Oral)  Resp 22  Ht 5\' 1"  (1.549 m)  Wt 125 lb (56.7 kg)  BMI 23.63 kg/m2  SpO2 97%  LMP 09/12/2013  Physical Exam General: Well-develope, well-nourished female in no acute distress; appearance consistent with age of record HENT: normocephalic; atraumatic Eyes: pupils equal, round and reactive to light; extraocular muscles intact Neck: supple Heart: regular rate and rhythm; no murmurs, rubs or gallops Lungs: clear to auscultation bilaterally Abdomen: soft; nondistended; nontender; bowel sounds present Extremities: No deformity; full range of motion Neurologic: Awake, alert and oriented; motor function intact in all extremities and symmetric; no facial droop Skin: Warm and dry Psychiatric: Depressed; no SI or  HI    ED Course  Procedures (including critical care time)   MDM   Nursing notes and vitals signs, including pulse oximetry, reviewed.  Summary of this visit's results, reviewed by myself:  Labs:  Results for orders placed during the hospital encounter of 10/03/13 (from the past 24 hour(s))  CBC WITH DIFFERENTIAL     Status: None   Collection Time    10/03/13 11:48 PM      Result Value Ref Range   WBC 7.3  4.0 - 10.5 K/uL   RBC 4.29  3.87 - 5.11 MIL/uL   Hemoglobin 13.2  12.0 - 15.0 g/dL   HCT 38.2  36.0 - 46.0 %   MCV 89.0  78.0 - 100.0 fL   MCH 30.8  26.0 - 34.0 pg   MCHC 34.6  30.0 - 36.0 g/dL   RDW 12.2  11.5 - 15.5 %   Platelets 355  150 - 400 K/uL   Neutrophils Relative % 48  43 - 77 %   Neutro Abs 3.5  1.7 - 7.7 K/uL   Lymphocytes Relative 40   12 - 46 %   Lymphs Abs 2.9  0.7 - 4.0 K/uL   Monocytes Relative 6  3 - 12 %   Monocytes Absolute 0.5  0.1 - 1.0 K/uL   Eosinophils Relative 5  0 - 5 %   Eosinophils Absolute 0.4  0.0 - 0.7 K/uL   Basophils Relative 1  0 - 1 %   Basophils Absolute 0.1  0.0 - 0.1 K/uL  COMPREHENSIVE METABOLIC PANEL     Status: Abnormal   Collection Time    10/03/13 11:48 PM      Result Value Ref Range   Sodium 143  137 - 147 mEq/L   Potassium 3.9  3.7 - 5.3 mEq/L   Chloride 103  96 - 112 mEq/L   CO2 29  19 - 32 mEq/L   Glucose, Bld 121 (*) 70 - 99 mg/dL   BUN 15  6 - 23 mg/dL   Creatinine, Ser 0.83  0.50 - 1.10 mg/dL   Calcium 9.6  8.4 - 10.5 mg/dL   Total Protein 8.0  6.0 - 8.3 g/dL   Albumin 4.0  3.5 - 5.2 g/dL   AST 28  0 - 37 U/L   ALT 39 (*) 0 - 35 U/L   Alkaline Phosphatase 65  39 - 117 U/L   Total Bilirubin 0.3  0.3 - 1.2 mg/dL   GFR calc non Af Amer >90  >90 mL/min   GFR calc Af Amer >90  >90 mL/min  ETHANOL     Status: None   Collection Time    10/03/13 11:48 PM      Result Value Ref Range   Alcohol, Ethyl (B) <11  0 - 11 mg/dL  URINE RAPID DRUG SCREEN (HOSP PERFORMED)     Status: Abnormal   Collection Time    10/04/13 12:32 AM      Result Value Ref Range   Opiates POSITIVE (*) NONE DETECTED   Cocaine POSITIVE (*) NONE DETECTED   Benzodiazepines POSITIVE (*) NONE DETECTED   Amphetamines NONE DETECTED  NONE DETECTED   Tetrahydrocannabinol NONE DETECTED  NONE DETECTED   Barbiturates NONE DETECTED  NONE DETECTED       Karen Chafe Tyese Finken, MD 10/04/13 0122

## 2013-10-04 NOTE — Progress Notes (Signed)
Amanda from TTS, stated pt accepted by [facility].  Wyvonnia Dusky, MHT/NS

## 2013-10-04 NOTE — ED Notes (Signed)
Pt resting quietly. No distress noted at this time.

## 2013-10-04 NOTE — ED Notes (Signed)
Pt reporting she is wanting help to get off psych medications and pain pills.  Pt states she has been unable to afford her psychiatric medications regularly, so takes them every other month when she can afford them.  Also states she cannot see her psychiatrist because he doesn't accept medicaid.  Pt admits to buying street narcotics during the times she doesn't have other medications.  Pt requesting inpatient treatment program.  Denies SI or HI at this time, but does admit to severe depression symptoms.

## 2013-10-04 NOTE — ED Notes (Signed)
Awaiting Adderall and Neurontin from pharmacy
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
## 2013-10-04 NOTE — ED Notes (Signed)
Breakfast tray given. °

## 2013-10-05 ENCOUNTER — Inpatient Hospital Stay (HOSPITAL_COMMUNITY)
Admission: EM | Admit: 2013-10-05 | Discharge: 2013-10-14 | DRG: 897 | Disposition: A | Discharge status: Home / Self Care | Payer: MEDICAID | Source: Intra-hospital | Attending: Psychiatry | Admitting: Psychiatry

## 2013-10-05 ENCOUNTER — Encounter (HOSPITAL_COMMUNITY): Payer: Self-pay | Admitting: Behavioral Health

## 2013-10-05 DIAGNOSIS — G4700 Insomnia, unspecified: Secondary | ICD-10-CM | POA: Diagnosis present

## 2013-10-05 DIAGNOSIS — R45851 Suicidal ideations: Secondary | ICD-10-CM

## 2013-10-05 DIAGNOSIS — F32A Depression, unspecified: Secondary | ICD-10-CM | POA: Diagnosis present

## 2013-10-05 DIAGNOSIS — F411 Generalized anxiety disorder: Secondary | ICD-10-CM | POA: Diagnosis present

## 2013-10-05 DIAGNOSIS — F329 Major depressive disorder, single episode, unspecified: Secondary | ICD-10-CM

## 2013-10-05 DIAGNOSIS — F1920 Other psychoactive substance dependence, uncomplicated: Principal | ICD-10-CM | POA: Diagnosis present

## 2013-10-05 DIAGNOSIS — F909 Attention-deficit hyperactivity disorder, unspecified type: Secondary | ICD-10-CM | POA: Diagnosis present

## 2013-10-05 DIAGNOSIS — F1020 Alcohol dependence, uncomplicated: Secondary | ICD-10-CM | POA: Diagnosis present

## 2013-10-05 DIAGNOSIS — F1120 Opioid dependence, uncomplicated: Secondary | ICD-10-CM

## 2013-10-05 DIAGNOSIS — Z23 Encounter for immunization: Secondary | ICD-10-CM

## 2013-10-05 DIAGNOSIS — F1994 Other psychoactive substance use, unspecified with psychoactive substance-induced mood disorder: Secondary | ICD-10-CM | POA: Diagnosis present

## 2013-10-05 DIAGNOSIS — G4710 Hypersomnia, unspecified: Secondary | ICD-10-CM | POA: Diagnosis present

## 2013-10-05 DIAGNOSIS — F322 Major depressive disorder, single episode, severe without psychotic features: Secondary | ICD-10-CM | POA: Diagnosis present

## 2013-10-05 MED ORDER — CLONIDINE HCL 0.1 MG PO TABS
0.1000 mg | ORAL_TABLET | Freq: Every day | ORAL | Status: AC
  Administered 2013-10-10: 0.1 mg via ORAL
  Filled 2013-10-05 (×2): qty 1

## 2013-10-05 MED ORDER — QUETIAPINE FUMARATE 50 MG PO TABS
ORAL_TABLET | ORAL | Status: AC
  Filled 2013-10-05: qty 1

## 2013-10-05 MED ORDER — CHLORDIAZEPOXIDE HCL 25 MG PO CAPS
25.0000 mg | ORAL_CAPSULE | Freq: Every day | ORAL | Status: AC
  Administered 2013-10-08: 25 mg via ORAL

## 2013-10-05 MED ORDER — ADULT MULTIVITAMIN W/MINERALS CH
1.0000 | ORAL_TABLET | Freq: Every day | ORAL | Status: DC
  Administered 2013-10-05 – 2013-10-14 (×10): 1 via ORAL
  Filled 2013-10-05 (×12): qty 1

## 2013-10-05 MED ORDER — CHLORDIAZEPOXIDE HCL 25 MG PO CAPS
25.0000 mg | ORAL_CAPSULE | ORAL | Status: AC
  Administered 2013-10-07 (×2): 25 mg via ORAL
  Filled 2013-10-05 (×2): qty 1

## 2013-10-05 MED ORDER — MAGNESIUM HYDROXIDE 400 MG/5ML PO SUSP: 30.0000 mL | Freq: Every day | ORAL | Status: DC | PRN

## 2013-10-05 MED ORDER — NAPROXEN 500 MG PO TABS
500.0000 mg | ORAL_TABLET | Freq: Two times a day (BID) | ORAL | Status: DC | PRN
  Administered 2013-10-05: 500 mg via ORAL
  Filled 2013-10-05: qty 1

## 2013-10-05 MED ORDER — METHOCARBAMOL 500 MG PO TABS
500.0000 mg | ORAL_TABLET | Freq: Three times a day (TID) | ORAL | Status: DC | PRN
  Administered 2013-10-05: 500 mg via ORAL
  Filled 2013-10-05: qty 1

## 2013-10-05 MED ORDER — CLONIDINE HCL 0.1 MG PO TABS
0.1000 mg | ORAL_TABLET | Freq: Four times a day (QID) | ORAL | Status: AC
  Administered 2013-10-05 – 2013-10-06 (×3): 0.1 mg via ORAL
  Filled 2013-10-05 (×7): qty 1

## 2013-10-05 MED ORDER — QUETIAPINE FUMARATE 50 MG PO TABS
75.0000 mg | ORAL_TABLET | Freq: Every day | ORAL | Status: DC
  Administered 2013-10-05 – 2013-10-06 (×2): 75 mg via ORAL
  Filled 2013-10-05 (×3): qty 1

## 2013-10-05 MED ORDER — HYDROXYZINE HCL 25 MG PO TABS
25.0000 mg | ORAL_TABLET | Freq: Four times a day (QID) | ORAL | Status: DC | PRN
  Administered 2013-10-05 – 2013-10-14 (×16): 25 mg via ORAL
  Filled 2013-10-05 (×15): qty 1

## 2013-10-05 MED ORDER — THIAMINE HCL 100 MG/ML IJ SOLN
100.0000 mg | Freq: Once | INTRAMUSCULAR | Status: DC
  Filled 2013-10-05: qty 2

## 2013-10-05 MED ORDER — CHLORDIAZEPOXIDE HCL 25 MG PO CAPS
25.0000 mg | ORAL_CAPSULE | Freq: Four times a day (QID) | ORAL | Status: AC
  Administered 2013-10-05 (×3): 25 mg via ORAL
  Filled 2013-10-05 (×3): qty 1

## 2013-10-05 MED ORDER — ALUM & MAG HYDROXIDE-SIMETH 200-200-20 MG/5ML PO SUSP: 30.0000 mL | ORAL | Status: DC | PRN

## 2013-10-05 MED ORDER — ONDANSETRON 4 MG PO TBDP
4.0000 mg | ORAL_TABLET | Freq: Four times a day (QID) | ORAL | Status: AC | PRN
  Administered 2013-10-07 (×2): 4 mg via ORAL

## 2013-10-05 MED ORDER — HYDROXYZINE HCL 25 MG PO TABS: 25.0000 mg | ORAL_TABLET | Freq: Four times a day (QID) | ORAL | Status: DC | PRN

## 2013-10-05 MED ORDER — ONDANSETRON 4 MG PO TBDP
4.0000 mg | ORAL_TABLET | Freq: Four times a day (QID) | ORAL | Status: AC | PRN
Start: 2013-10-05 — End: 2013-10-10
  Administered 2013-10-05: 4 mg via ORAL
  Filled 2013-10-05 (×5): qty 1

## 2013-10-05 MED ORDER — INFLUENZA VAC SPLIT QUAD 0.5 ML IM SUSP
0.5000 mL | INTRAMUSCULAR | Status: DC
  Filled 2013-10-05: qty 0.5

## 2013-10-05 MED ORDER — QUETIAPINE FUMARATE 25 MG PO TABS
ORAL_TABLET | ORAL | Status: AC
  Filled 2013-10-05: qty 1

## 2013-10-05 MED ORDER — CLONIDINE HCL 0.1 MG PO TABS
0.1000 mg | ORAL_TABLET | ORAL | Status: AC
  Administered 2013-10-07 – 2013-10-08 (×3): 0.1 mg via ORAL
  Filled 2013-10-05 (×5): qty 1

## 2013-10-05 MED ORDER — VITAMIN B-1 100 MG PO TABS
100.0000 mg | ORAL_TABLET | Freq: Every day | ORAL | Status: DC
  Administered 2013-10-06 – 2013-10-14 (×9): 100 mg via ORAL
  Filled 2013-10-05 (×11): qty 1

## 2013-10-05 MED ORDER — CHLORDIAZEPOXIDE HCL 25 MG PO CAPS
25.0000 mg | ORAL_CAPSULE | Freq: Once | ORAL | Status: DC
  Filled 2013-10-05: qty 1

## 2013-10-05 MED ORDER — METHOCARBAMOL 500 MG PO TABS
500.0000 mg | ORAL_TABLET | Freq: Three times a day (TID) | ORAL | Status: AC | PRN
  Administered 2013-10-07 – 2013-10-09 (×3): 500 mg via ORAL
  Filled 2013-10-05 (×3): qty 1

## 2013-10-05 MED ORDER — DICYCLOMINE HCL 20 MG PO TABS
20.0000 mg | ORAL_TABLET | Freq: Four times a day (QID) | ORAL | Status: AC | PRN
  Administered 2013-10-05: 20 mg via ORAL
  Filled 2013-10-05 (×6): qty 1

## 2013-10-05 MED ORDER — LOPERAMIDE HCL 2 MG PO CAPS
2.0000 mg | ORAL_CAPSULE | ORAL | Status: AC | PRN
  Administered 2013-10-06 – 2013-10-07 (×3): 4 mg via ORAL
  Administered 2013-10-07: 2 mg via ORAL
  Filled 2013-10-05 (×4): qty 2

## 2013-10-05 MED ORDER — DICYCLOMINE HCL 20 MG PO TABS
20.0000 mg | ORAL_TABLET | Freq: Four times a day (QID) | ORAL | Status: AC | PRN
  Administered 2013-10-06 – 2013-10-10 (×9): 20 mg via ORAL
  Filled 2013-10-05 (×4): qty 1

## 2013-10-05 MED ORDER — NAPROXEN 500 MG PO TABS
500.0000 mg | ORAL_TABLET | Freq: Two times a day (BID) | ORAL | Status: AC | PRN
  Administered 2013-10-06 – 2013-10-10 (×5): 500 mg via ORAL
  Filled 2013-10-05 (×5): qty 1

## 2013-10-05 MED ORDER — TRAZODONE HCL 50 MG PO TABS: 50.0000 mg | ORAL_TABLET | Freq: Every evening | ORAL | Status: DC | PRN

## 2013-10-05 MED ORDER — LOPERAMIDE HCL 2 MG PO CAPS: 2.0000 mg | ORAL_CAPSULE | ORAL | Status: DC | PRN

## 2013-10-05 MED ORDER — ACETAMINOPHEN 325 MG PO TABS
650.0000 mg | ORAL_TABLET | Freq: Four times a day (QID) | ORAL | Status: DC | PRN
  Administered 2013-10-08 – 2013-10-13 (×3): 650 mg via ORAL
  Filled 2013-10-05 (×4): qty 2

## 2013-10-05 MED ORDER — CHLORDIAZEPOXIDE HCL 25 MG PO CAPS
25.0000 mg | ORAL_CAPSULE | Freq: Three times a day (TID) | ORAL | Status: AC
  Administered 2013-10-06 (×3): 25 mg via ORAL
  Filled 2013-10-05 (×3): qty 1

## 2013-10-05 NOTE — Progress Notes (Signed)
Patient resting quietly with eyes closed. Respirations even and unlabored. No distress noted . Q 15 minute check continues as ordered to maintain safety. 

## 2013-10-05 NOTE — Progress Notes (Signed)
Nursing Admission Note:  33 y/o female who presents voluntarily for opiate and benzo detox.  Patient states she also would like help with cocaine abuse.  Patient states she was abusing opiates (percocet, Vicodin, Roxicodone) for 8 years.  Patient states she had a prescription for suboxone but states she was unable to afford it and when she ran out she began using pain pills again.  Patient states her longest time of sobriety was 3 months.  Patient states she also used cocaine to try to prevent withdrawal symptoms from opiates but states it did not help.  Patient states she had a prescription for xanax but states she wants to stop using that also.  Patient  States in December 2014 she lost custody of her 21 year old son.  Patient states her son is living with her sister currently.  Patient states she wants to get clean and get into a program so she can get custody of her son back.  Patient states originally she she was having suicidal thoughts but now denies.  Patient also denies HI and denies AVH.  Patient states she has attempted suicide in the past by attempted overdose 2013.  Patient states she is currently on probation for trying to pawn stolen goods.  Patient skin assessed and patient has multiple tattoos.  Consents obtained, fall safety plan explained and patient verbalized understanding.  Patient belongings secured in locker #49 and patient has one duffle bag on the shelf.  Patient escorted and oriented to the unit.  Food and fluids offered and patient accepted both.  Patient offered no additional questions or concerns.

## 2013-10-05 NOTE — Progress Notes (Signed)
Patient ID: [name], female   DOB: January 06, 1981, 34 y.o.   MRN: [id number] She has been in bed except for medications. She has c/o being dizzy and of gereralized withdrawal pain. Prn medications given and she reported that prn's helped a little. Her self inventory this AM was Depression 8, hopelessness 7, withdrawals of diarrhea and tremors. She denies SI thoughts. Pain of dizziness light headness,headache and blurred vision.

## 2013-10-05 NOTE — Progress Notes (Signed)
Adult Psychoeducational Group Note  Date:  10/05/2013 Time:  10:00 am  Group Topic/Focus:  Recovery Goals:   The focus of this group is to identify appropriate goals for recovery and establish a plan to achieve them.  Participation Level:  Did Not Attend   [name] 10/05/2013, 1:39 PM
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
## 2013-10-05 NOTE — BHH Group Notes (Signed)
Adult Psychoeducational Group Note  Date:  10/05/2013 Time:  0900am  Group Topic/Focus:  Goals Group:   The focus of this group is to help patients establish daily goals to achieve during treatment and discuss how the patient can incorporate goal setting into their daily lives to aide in recovery. Orientation:   The focus of this group is to educate the patient on the purpose and policies of crisis stabilization and provide a format to answer questions about their admission.  The group details unit policies and expectations of patients while admitted.  Participation Level:  Did Not Attend  Wynn Banker 10/05/2013, 9:41 AM

## 2013-10-05 NOTE — ED Notes (Signed)
[name] with [employer] now transporting patient to [facility].

## 2013-10-05 NOTE — BHH Group Notes (Signed)
[place] LCSW Group Therapy  10/05/2013 3:12 PM  Type of Therapy:  Group Therapy  Participation Level:  Did Not Attend-CSW went to see pt after group in attempt to complete PSA and discuss aftercare. Pt in bed sleeping. CSW will meet with pt tomorrow AM to complete PSA/talk about aftercare options.   Smart, Aubrie Lucien LCSWA 10/05/2013, 3:12 PM

## 2013-10-05 NOTE — Tx Team (Signed)
Initial Interdisciplinary Treatment Plan  PATIENT STRENGTHS: (choose at least two) Ability for insight Communication skills Motivation for treatment/growth Supportive family/friends  PATIENT STRESSORS: Financial difficulties Marital or family conflict Occupational concerns Substance abuse   PROBLEM LIST: Problem List/Patient Goals Date to be addressed Date deferred Reason deferred Estimated date of resolution  Substance abuse "opiates, cocaine, benzos 10/05/2013     depression 10/05/2013     anxiety 10/05/2013     "I need to get into a program so I can custody of my 3 year son back." 10/05/2013                                    DISCHARGE CRITERIA:  Ability to meet basic life and health needs Adequate post-discharge living arrangements Improved stabilization in mood, thinking, and/or behavior Motivation to continue treatment in a less acute level of care Reduction of life-threatening or endangering symptoms to within safe limits Safe-care adequate arrangements made Withdrawal symptoms are absent or subacute and managed without 24-hour nursing intervention  PRELIMINARY DISCHARGE PLAN: Attend aftercare/continuing care group Attend 12-step recovery group Placement in alternative living arrangements  PATIENT/FAMIILY INVOLVEMENT: This treatment plan has been presented to and reviewed with the patient, [name].  The patient and family have been given the opportunity to ask questions and make suggestions.  Gerrianne, Aydelott M 10/05/2013, 2:21 AM

## 2013-10-05 NOTE — Progress Notes (Signed)
Recreation Therapy Notes  Animal-Assisted Activity/Therapy (AAA/T) Program Checklist/Progress Notes Patient Eligibility Criteria Checklist & Daily Group note for Rec Tx Intervention  Date: 03.17.2015 Time: 2:45am Location: [address]   AAA/T Program Assumption of Risk Form signed by Patient/ or Parent Legal Guardian yes  Patient is free of allergies or sever asthma yes  Patient reports no fear of animals yes  Patient reports no history of cruelty to animals yes   Patient understands his/her participation is voluntary yes  Behavioral Response: DID NOT ATTEND   Laureen Ochs Lagina Reader, LRT/CTRS  [place] Yepiz L 10/05/2013 4:20 PM

## 2013-10-06 DIAGNOSIS — F1920 Other psychoactive substance dependence, uncomplicated: Secondary | ICD-10-CM

## 2013-10-06 NOTE — Tx Team (Signed)
Interdisciplinary Treatment Plan Update (Adult)  Date: 10/06/2013   Time Reviewed: 11:27 AM  Progress in Treatment:  Attending groups: No.  Participating in groups:  No.  Taking medication as prescribed: Yes  Tolerating medication: Yes  Family/Significant othe contact made: Not yet. SPE required for this pt.   Patient understands diagnosis: Yes, AEB seeking treatment for cocaine abuse, opiate/benzo detox, mood stabilization, and passive SI.  Discussing patient identified problems/goals with staff: Yes  Medical problems stabilized or resolved: Yes  Denies suicidal/homicidal ideation: Yes during self report.  Patient has not harmed self or Others: Yes  New problem(s) identified:  Discharge Plan or Barriers: Pt not attending d/c planning group. CSW will meet with pt this afternoon to complete PSA/discuss aftercare, as pt is currently sleeping in room.  Additional comments: [name] is 22, Caucasian female. Admitted from the [facility]. She reports, "My grand-mother took me to [facility]. I have been abusing pain pills, it is worsening. I'm ready to get off pain pills. I have been using non-stop x 1 year. I got on Suboxen for my opioid dependence, was on it x 3 years. Unable to afford it, I relasped. I buy my opiates off the streets. I use 6-7 pills of Roxys, Percocet and Opana daily x 12 months. I use drugs to feel normal, I don't get high off of them. My longest sobriety is 3 months, that was when I was on Suboxen. My plan is to go to a rehab center from here. I was diagnosed with Bipolar manic depression. I was on Seroquel. I also suffer from sleeping too much during the day. I was on Adderrall for it. I'm not suicidal, rather Hx of attempt by overdose. I'm going through nausea, muscle cramps and headaches right now". Reason for Continuation of Hospitalization: Librium taper-withdrawals Clonidine-withdrawals Mood stabilization Medication management  Estimated length of stay: 3-5  days  For review of initial/current patient goals, please see plan of care.  Attendees:  Patient:    Family:    Physician:    Nursing: Adonis Brook 10/06/2013 11:26 AM   Clinical Social Worker [place], [place]  10/06/2013 11:26 AM   Other: Adonis Huguenin RN 10/06/2013 11:27 AM   Other: Hardie Pulley. PA 10/06/2013 11:27 AM   Other: Gerline Legacy Nurse CM  10/06/2013 11:27 AM   Other:    Scribe for Treatment Team:  [place] LCSWA 10/06/2013 11:27 AM

## 2013-10-06 NOTE — Progress Notes (Signed)
  D Pt. Denies SI and HI, no complaints of pain or discomfort noted.  A Writer offered support and encouragement. Discussed coping skills with pt.  R Pt. Remains safe on the unit.  Pt. Has been in bed most of the day.  She reports to Probation officer that she is just very tired and this is normal for her the first couple of days going through withdrawals.  Pt. States she will be up and more active tomorrow.  Writer encouraged pt. To eat and drink plenty of fluids. Pt. Showed writer that she had eat some of her lunch.  Will continue to monitor for pt. Safety. Pt. States she will be going to long term treatment from [facility].  Pt. Has a 3 year and 34 year old at home to care for.

## 2013-10-06 NOTE — BHH Group Notes (Signed)
[place] LCSW Group Therapy  10/06/2013 3:02 PM  Type of Therapy:  Group Therapy  Participation Level:  Did Not Attend-pt in bed sleeping/refused to attend group.   Smart, Arhaan Chesnut LCSWA  10/06/2013, 3:02 PM

## 2013-10-06 NOTE — Progress Notes (Signed)
Pt has been in bed most the morning.  She did come to medication window for her 0800 meds.  Her 0800 and noon clonidine was held d/t hypotensive.  She was given gatorade this morning which she did not drink.  She stated,"I have not been able to eat since I got here" She did report that she went down for dinner last night but was unable to eat. She did drink the gatorade given to her just before noon today. Staff brought pt food on the unit. She rated her depression 9 hopelessness 8 and her anxiety 10 on her self-inventory.  She denies any S/H ideation or A/V/H.  She stated,"I sleep all day if I'm not taking my Adderrall"

## 2013-10-06 NOTE — Clinical Social Work Note (Signed)
CSW attempted to meet with pt to complete PSA/discuss aftercare. Pt sleeping heavily in room. CSW will attempt to meet with pt tomorrow AM to complete above.  [place], [place] 10/06/2013 3:03 PM

## 2013-10-06 NOTE — BHH Group Notes (Signed)
[facility] LCSW Aftercare Discharge Planning Group Note   10/06/2013 11:00 AM  Participation Quality:  DID NOT ATTEND-pt in bed resting/did not come to group.   Smart, [employer]

## 2013-10-06 NOTE — Progress Notes (Addendum)
[facility] MD Progress Note  10/06/2013 3:59 PM [name]  MRN:  [id number]  Subjective:  [name] reports, "I have no energy, I feel tired. That why I'm lying down in bed. I'm still sleeping all the time during the day. I'm still having bad withdrawal symptoms, sweaty, nauseated, diarrhea, hot/cold flashes. I feel like things are crawling all over me". Rated depression and anxiety both at #9. Denies SIHI, AVH.  Diagnosis:   DSM5: Schizophrenia Disorders:  NA Obsessive-Compulsive Disorders:  NA Trauma-Stressor Disorders:  NA Substance/Addictive Disorders:  Polysubstance (including opioids) dependence with physiological dependence  Depressive Disorders:  Substance induced mood disorder  Total Time spent with patient: 35 minutes  Axis I: Polysubstance (including opioids) dependence with physiological dependence Axis II: Deferred Axis III:  Past Medical History  Diagnosis Date  . Pinworms   . Depression   . Pituitary tumor    Axis IV: Polysubstance dependence Axis V: 41-50 serious symptoms  ADL's:  Intact  Sleep: Poor  Appetite:  Fair  Suicidal Ideation:  Plan:  Denies Intent:  Denies Means:  denies Homicidal Ideation:  Plan:  Denies Intent:  Denies Means:  Denies AEB (as evidenced by):  Psychiatric Specialty Exam: Physical Exam  Review of Systems  Constitutional: Positive for malaise/fatigue.  HENT: Negative.   Eyes: Negative.   Cardiovascular: Negative.   Gastrointestinal: Negative.   Genitourinary: Negative.   Musculoskeletal: Positive for myalgias.  Skin: Negative.   Neurological: Positive for tremors and weakness.  Endo/Heme/Allergies: Negative.   Psychiatric/Behavioral: Positive for depression and substance abuse (Opioid dependence). Negative for suicidal ideas, hallucinations and memory loss. The patient is nervous/anxious and has insomnia.     Blood pressure 87/53, pulse 96, temperature 97.5 F (36.4 C), temperature source Oral, resp. rate 16, height  5\' 1"  (1.549 m), weight 61.689 kg (136 lb), last menstrual period 09/12/2013, SpO2 98.00%.Body mass index is 25.71 kg/(m^2).  General Appearance: Casual  Eye Contact::  Fair  Speech:  Clear and Coherent  Volume:  Normal  Mood:  Anxious and Depressed  Affect:  Flat  Thought Process:  Coherent  Orientation:  Full (Time, Place, and Person)  Thought Content:  Rumination, tactile hallucinations  Suicidal Thoughts:  No  Homicidal Thoughts:  No  Memory:  Immediate;   Good Recent;   Good Remote;   Good  Judgement:  Fair  Insight:  Fair  Psychomotor Activity:  Restlessness  Concentration:  Fair  Recall:  Good  Fund of Knowledge:Fair  Language: Good  Akathisia:  No  Handed:  Right  AIMS (if indicated):     Assets:  Desire for Improvement  Sleep:  Number of Hours: 4   Musculoskeletal: Strength & Muscle Tone: within normal limits Gait & Station: normal Patient leans: N/A  Current Medications: Current Facility-Administered Medications  Medication Dose Route Frequency Provider Last Rate Last Dose  . acetaminophen (TYLENOL) tablet 650 mg  650 mg Oral Q6H PRN Evanna Glenda Chroman, NP      . alum & mag hydroxide-simeth (MAALOX/MYLANTA) 200-200-20 MG/5ML suspension 30 mL  30 mL Oral Q4H PRN Evanna Glenda Chroman, NP      . chlordiazePOXIDE (LIBRIUM) capsule 25 mg  25 mg Oral Once Encarnacion Slates, NP      . chlordiazePOXIDE (LIBRIUM) capsule 25 mg  25 mg Oral TID Encarnacion Slates, NP   25 mg at 10/06/13 1208   Followed by  . [START ON 10/07/2013] chlordiazePOXIDE (LIBRIUM) capsule 25 mg  25 mg Oral BH-qamhs Encarnacion Slates, NP  Followed by  . [START ON 10/08/2013] chlordiazePOXIDE (LIBRIUM) capsule 25 mg  25 mg Oral Daily Encarnacion Slates, NP      . cloNIDine (CATAPRES) tablet 0.1 mg  0.1 mg Oral QID Encarnacion Slates, NP   0.1 mg at 10/05/13 1150   Followed by  . [START ON 10/07/2013] cloNIDine (CATAPRES) tablet 0.1 mg  0.1 mg Oral BH-qamhs Encarnacion Slates, NP       Followed by  . [START ON 10/09/2013]  cloNIDine (CATAPRES) tablet 0.1 mg  0.1 mg Oral QAC breakfast Encarnacion Slates, NP      . dicyclomine (BENTYL) tablet 20 mg  20 mg Oral Q6H PRN Malena Peer, NP   20 mg at 10/05/13 1012  . dicyclomine (BENTYL) tablet 20 mg  20 mg Oral Q6H PRN Encarnacion Slates, NP   20 mg at 10/06/13 0046  . hydrOXYzine (ATARAX/VISTARIL) tablet 25 mg  25 mg Oral Q6H PRN Malena Peer, NP   25 mg at 10/05/13 2145  . influenza vac split quadrivalent PF (FLUARIX) injection 0.5 mL  0.5 mL Intramuscular Tomorrow-1000 Nicholaus Bloom, MD      . loperamide (IMODIUM) capsule 2-4 mg  2-4 mg Oral PRN Encarnacion Slates, NP      . magnesium hydroxide (MILK OF MAGNESIA) suspension 30 mL  30 mL Oral Daily PRN Evanna Glenda Chroman, NP      . methocarbamol (ROBAXIN) tablet 500 mg  500 mg Oral Q8H PRN Encarnacion Slates, NP      . multivitamin with minerals tablet 1 tablet  1 tablet Oral Daily Encarnacion Slates, NP   1 tablet at 10/06/13 1610  . naproxen (NAPROSYN) tablet 500 mg  500 mg Oral BID PRN Encarnacion Slates, NP   500 mg at 10/06/13 0050  . ondansetron (ZOFRAN-ODT) disintegrating tablet 4 mg  4 mg Oral Q6H PRN Malena Peer, NP   4 mg at 10/05/13 0647  . ondansetron (ZOFRAN-ODT) disintegrating tablet 4 mg  4 mg Oral Q6H PRN Encarnacion Slates, NP      . QUEtiapine (SEROQUEL) tablet 75 mg  75 mg Oral QHS Malena Peer, NP   75 mg at 10/05/13 2146  . thiamine (B-1) injection 100 mg  100 mg Intramuscular Once Encarnacion Slates, NP      . thiamine (VITAMIN B-1) tablet 100 mg  100 mg Oral Daily Encarnacion Slates, NP   100 mg at 10/06/13 9604    Lab Results: No results found for this or any previous visit (from the past 48 hour(s)).  Physical Findings: AIMS: Facial and Oral Movements Muscles of Facial Expression: None, normal Lips and Perioral Area: None, normal Jaw: None, normal Tongue: None, normal,Extremity Movements Upper (arms, wrists, hands, fingers): None, normal Lower (legs, knees, ankles, toes): None, normal, Trunk  Movements Neck, shoulders, hips: None, normal, Overall Severity Severity of abnormal movements (highest score from questions above): None, normal Incapacitation due to abnormal movements: None, normal, Dental Status Current problems with teeth and/or dentures?: Yes Does patient usually wear dentures?: No  CIWA:  CIWA-Ar Total: 5 COWS:  COWS Total Score: 4  Treatment Plan Summary: Daily contact with patient to assess and evaluate symptoms and progress in treatment Medication management  Plan: Review of chart, vital signs, medications, and notes. 1-Individual and group therapy 2-Medication management for depression and anxiety:  Medications reviewed with the patient, denies any adverse effects. 3-Coping skills for depression, anxiety, and substance dependency 4-Continue  crisis stabilization and management 5-Address health issues--monitoring vital signs, stable 6-Treatment plan in progress to prevent relapse of depression, substance abuse, and anxiety  Medical Decision Making Problem Points:  Established problem, stable/improving (1), Review of last therapy session (1) and Review of psycho-social stressors (1) Data Points:  Review of medication regiment & side effects (2) Review of new medications or change in dosage (2)  I certify that inpatient services furnished can reasonably be expected to improve the patient's condition.   Encarnacion Slates, Pace 10/06/2013, 3:59 PM Patient seen, evaluated and I agree with notes by Nurse Practitioner. Corena Pilgrim, MD

## 2013-10-07 MED ORDER — ATOMOXETINE HCL 25 MG PO CAPS
25.0000 mg | ORAL_CAPSULE | Freq: Two times a day (BID) | ORAL | Status: DC
  Administered 2013-10-07 – 2013-10-09 (×4): 25 mg via ORAL
  Filled 2013-10-07 (×6): qty 1

## 2013-10-07 MED ORDER — QUETIAPINE FUMARATE 100 MG PO TABS
100.0000 mg | ORAL_TABLET | Freq: Every day | ORAL | Status: DC
  Administered 2013-10-07 – 2013-10-10 (×4): 100 mg via ORAL
  Filled 2013-10-07 (×7): qty 1

## 2013-10-07 NOTE — Progress Notes (Signed)
Patient ID: [name], female   DOB: 09-05-1980, 33 y.o.   MRN: [id number] [facility] MD Progress Note  10/07/2013 12:30 PM OTA [place]  MRN:  [id number]  Subjective:  [name] reports that she is still not doing well. She says she stays in bed because she does not have her Adderrall to keep her awake and agile during the day. She reports still going through bad withdrawal symptoms, shakes, diarrhea, abdominal pains and generalized weakness. She is insomnia at night time.  Diagnosis:   DSM5: Schizophrenia Disorders:  NA Obsessive-Compulsive Disorders:  NA Trauma-Stressor Disorders:  NA Substance/Addictive Disorders:  Polysubstance (including opioids) dependence with physiological dependence  Depressive Disorders:  Substance induced mood disorder  Total Time spent with patient: 35 minutes  Axis I: Polysubstance (including opioids) dependence with physiological dependence Axis II: Deferred Axis III:  Past Medical History  Diagnosis Date  . Pinworms   . Depression   . Pituitary tumor    Axis IV: Polysubstance dependence Axis V: 41-50 serious symptoms  ADL's:  Intact  Sleep: Poor  Appetite:  Fair  Suicidal Ideation:  Plan:  Denies Intent:  Denies Means:  denies Homicidal Ideation:  Plan:  Denies Intent:  Denies Means:  Denies  AEB (as evidenced by):  Psychiatric Specialty Exam: Physical Exam  Review of Systems  Constitutional: Positive for malaise/fatigue.  HENT: Negative.   Eyes: Negative.   Cardiovascular: Negative.   Gastrointestinal: Negative.   Genitourinary: Negative.   Musculoskeletal: Positive for myalgias.  Skin: Negative.   Neurological: Positive for tremors and weakness.  Endo/Heme/Allergies: Negative.   Psychiatric/Behavioral: Positive for depression and substance abuse (Opioid dependence). Negative for suicidal ideas, hallucinations and memory loss. The patient is nervous/anxious and has insomnia.     Blood pressure 98/64, pulse 91,  temperature 97.8 F (36.6 C), temperature source Oral, resp. rate 16, height 5\' 1"  (1.549 m), weight 61.689 kg (136 lb), last menstrual period 09/12/2013, SpO2 98.00%.Body mass index is 25.71 kg/(m^2).  General Appearance: Casual  Eye Contact::  Fair  Speech:  Clear and Coherent  Volume:  Normal  Mood:  Anxious and Depressed  Affect:  Flat  Thought Process:  Coherent  Orientation:  Full (Time, Place, and Person)  Thought Content:  Rumination, tactile hallucinations  Suicidal Thoughts:  No  Homicidal Thoughts:  No  Memory:  Immediate;   Good Recent;   Good Remote;   Good  Judgement:  Fair  Insight:  Fair  Psychomotor Activity:  Restlessness  Concentration:  Fair  Recall:  Good  Fund of Knowledge:Fair  Language: Good  Akathisia:  No  Handed:  Right  AIMS (if indicated):     Assets:  Desire for Improvement  Sleep:  Number of Hours: 6.25   Musculoskeletal: Strength & Muscle Tone: within normal limits Gait & Station: normal Patient leans: N/A  Current Medications: Current Facility-Administered Medications  Medication Dose Route Frequency Provider Last Rate Last Dose  . acetaminophen (TYLENOL) tablet 650 mg  650 mg Oral Q6H PRN Evanna Glenda Chroman, NP      . alum & mag hydroxide-simeth (MAALOX/MYLANTA) 200-200-20 MG/5ML suspension 30 mL  30 mL Oral Q4H PRN Evanna Glenda Chroman, NP      . atomoxetine (STRATTERA) capsule 25 mg  25 mg Oral BID WC Encarnacion Slates, NP      . chlordiazePOXIDE (LIBRIUM) capsule 25 mg  25 mg Oral Once Encarnacion Slates, NP      . chlordiazePOXIDE (LIBRIUM) capsule 25 mg  25 mg Oral  BH-qamhs Encarnacion Slates, NP   25 mg at 10/07/13 [id number]   Followed by  . [START ON 10/08/2013] chlordiazePOXIDE (LIBRIUM) capsule 25 mg  25 mg Oral Daily Encarnacion Slates, NP      . cloNIDine (CATAPRES) tablet 0.1 mg  0.1 mg Oral BH-qamhs Encarnacion Slates, NP       Followed by  . [START ON 10/09/2013] cloNIDine (CATAPRES) tablet 0.1 mg  0.1 mg Oral QAC breakfast Encarnacion Slates, NP      .  dicyclomine (BENTYL) tablet 20 mg  20 mg Oral Q6H PRN Malena Peer, NP   20 mg at 10/05/13 1012  . dicyclomine (BENTYL) tablet 20 mg  20 mg Oral Q6H PRN Encarnacion Slates, NP   20 mg at 10/06/13 1716  . hydrOXYzine (ATARAX/VISTARIL) tablet 25 mg  25 mg Oral Q6H PRN Malena Peer, NP   25 mg at 10/06/13 2010  . influenza vac split quadrivalent PF (FLUARIX) injection 0.5 mL  0.5 mL Intramuscular Tomorrow-1000 Nicholaus Bloom, MD      . loperamide (IMODIUM) capsule 2-4 mg  2-4 mg Oral PRN Encarnacion Slates, NP   4 mg at 10/06/13 2010  . magnesium hydroxide (MILK OF MAGNESIA) suspension 30 mL  30 mL Oral Daily PRN Evanna Glenda Chroman, NP      . methocarbamol (ROBAXIN) tablet 500 mg  500 mg Oral Q8H PRN Encarnacion Slates, NP      . multivitamin with minerals tablet 1 tablet  1 tablet Oral Daily Encarnacion Slates, NP   1 tablet at 10/07/13 0813  . naproxen (NAPROSYN) tablet 500 mg  500 mg Oral BID PRN Encarnacion Slates, NP   500 mg at 10/06/13 2010  . ondansetron (ZOFRAN-ODT) disintegrating tablet 4 mg  4 mg Oral Q6H PRN Malena Peer, NP   4 mg at 10/05/13 0647  . ondansetron (ZOFRAN-ODT) disintegrating tablet 4 mg  4 mg Oral Q6H PRN Encarnacion Slates, NP      . QUEtiapine (SEROQUEL) tablet 100 mg  100 mg Oral QHS Encarnacion Slates, NP      . thiamine (B-1) injection 100 mg  100 mg Intramuscular Once Encarnacion Slates, NP      . thiamine (VITAMIN B-1) tablet 100 mg  100 mg Oral Daily Encarnacion Slates, NP   100 mg at 10/07/13 0813    Lab Results: No results found for this or any previous visit (from the past 48 hour(s)).  Physical Findings: AIMS: Facial and Oral Movements Muscles of Facial Expression: None, normal Lips and Perioral Area: None, normal Jaw: None, normal Tongue: None, normal,Extremity Movements Upper (arms, wrists, hands, fingers): None, normal Lower (legs, knees, ankles, toes): None, normal, Trunk Movements Neck, shoulders, hips: None, normal, Overall Severity Severity of abnormal movements  (highest score from questions above): None, normal Incapacitation due to abnormal movements: None, normal Patient's awareness of abnormal movements (rate only patient's report): No Awareness, Dental Status Current problems with teeth and/or dentures?: Yes Does patient usually wear dentures?: No  CIWA:  CIWA-Ar Total: 5 COWS:  COWS Total Score: 4  Treatment Plan Summary: Daily contact with patient to assess and evaluate symptoms and progress in treatment Medication management  Plan: Review of chart, vital signs, medications, and notes. 1-Individual and group therapy 2-Medication management for depression and anxiety:  Medications reviewed with the patient, denies any adverse effects, increased Seroquel from 75 mg to 100 mg Q hs for mood control and  initiated Strattera 25 mg bid for ADHD. 3-Coping skills for depression, anxiety, and substance dependency 4-Continue crisis stabilization and management 5-Address health issues--monitoring vital signs, stable 6-Treatment plan in progress to prevent relapse of depression, substance abuse, and anxiety  Medical Decision Making Problem Points:  Established problem, stable/improving (1), Review of last therapy session (1) and Review of psycho-social stressors (1) Data Points:  Review of medication regiment & side effects (2) Review of new medications or change in dosage (2)  I certify that inpatient services furnished can reasonably be expected to improve the patient's condition.   Lindell Spar I, PMHNP 10/07/2013, 12:30 PM

## 2013-10-07 NOTE — Progress Notes (Signed)
Pt did not attend AA meeting 

## 2013-10-07 NOTE — Progress Notes (Signed)
Patient ID: [name], female   DOB: [birth year], 33 y.o.   MRN: [id number] She has been in bed except to get her medication Aand has refused to eat. Has not requested any prn medication just says that she has sleep apnea and that she is not getting her correct medication. 08:00 Self inventory: depression and hopelessness at 9. Denies SI thoughts, withdrawal symptoms of tremors, none noted, diarrhea, craving, and chilling. Physical problems of lightheadedness, dizziness, headache, and blurred vision .

## 2013-10-07 NOTE — BHH Group Notes (Signed)
[facility] LCSW Group Therapy  10/07/2013 3:39 PM  Type of Therapy:  Group Therapy  Participation Level:  Did Not Attend pt sleeping in room and did not get up to attend group.   Smart, Kynan Peasley LCSWA  10/07/2013, 3:39 PM

## 2013-10-07 NOTE — Progress Notes (Signed)
0900 morning group  The focus of this group is to educate the patient on the purpose and policies of crisis stabilization and provide a format to answer questions about their admission.  The group details unit policies and expectations of patients while admitted.  Pt did not attend

## 2013-10-07 NOTE — Progress Notes (Signed)
Patient ID: [name], female   DOB: October 24, 1980, 33 y.o.   MRN: [id number] Client spending most of her time in bed due to opiate withdrawals; she is complaining of N/V, diarrhea, hot/cold chills, and body aches. Gave gatorade and encouraged fluids. Denies SI. She states her last use was on Monday and she was using up to 30mg  of roxy/oxy per day off the streets due to not being able to afford suboxone. Will continue to monitor q 15 minutes on safety checks.

## 2013-10-07 NOTE — Progress Notes (Signed)
Patient ID: [name], female   DOB: 1980-08-17, 33 y.o.   MRN: [id number]  Patient resting in bed with eyes closed; no s/s of distress noted at this time. Respirations regular and unlabored.
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
## 2013-10-07 NOTE — Progress Notes (Signed)
Recreation Therapy Notes  Animal-Assisted Activity/Therapy (AAA/T) Program Checklist/Progress Notes Patient Eligibility Criteria Checklist & Daily Group note for Rec Tx Intervention  Date: 03.19.2015 Time: 2:45pm Location: [address]   AAA/T Program Assumption of Risk Form signed by Patient/ or Parent Legal Guardian yes  Patient is free of allergies or sever asthma yes  Patient reports no fear of animals yes  Patient reports no history of cruelty to animals yes   Patient understands his/her participation is voluntary yes  Behavioral Response: DID NOT ATTEND.   Laureen Ochs Benisha Hadaway, LRT/CTRS  Lane Hacker 10/07/2013 4:37 PM

## 2013-10-08 NOTE — Progress Notes (Signed)
Patient ID: [name], female   DOB: 1981/03/02, 33 y.o.   MRN: [id number] Patient ID: [name], female   DOB: 05/22/1981, 33 y.o.   MRN: [id number] [facility] MD Progress Note  10/08/2013 4:44 PM [name]  MRN:  [id number]  Subjective:  [name] reports that she is still she is not feeling the effects of Strattera yet. Says she has been up today to breakfast. She reports still having bad withdrawal symptoms.  [name] is still not very active. She lies in bed with the light off. She appears relaxed and rested, yet complains she is still having bad withdrawal symptoms. She was started on Strattera bid yesterday for ADHD. She is currently denying any adverse effects.  Diagnosis:   DSM5: Schizophrenia Disorders:  NA Obsessive-Compulsive Disorders:  NA Trauma-Stressor Disorders:  NA Substance/Addictive Disorders:  Polysubstance (including opioids) dependence with physiological dependence  Depressive Disorders:  Substance induced mood disorder  Total Time spent with patient: 35 minutes  Axis I: Polysubstance (including opioids) dependence with physiological dependence Axis II: Deferred Axis III:  Past Medical History  Diagnosis Date  . Pinworms   . Depression   . Pituitary tumor    Axis IV: Polysubstance dependence Axis V: 41-50 serious symptoms  ADL's:  Intact  Sleep: Poor  Appetite:  Fair  Suicidal Ideation:  Plan:  Denies Intent:  Denies Means:  denies Homicidal Ideation:  Plan:  Denies Intent:  Denies Means:  Denies  AEB (as evidenced by):  Psychiatric Specialty Exam: Physical Exam  Review of Systems  Constitutional: Positive for malaise/fatigue.  HENT: Negative.   Eyes: Negative.   Cardiovascular: Negative.   Gastrointestinal: Negative.   Genitourinary: Negative.   Musculoskeletal: Positive for myalgias.  Skin: Negative.   Neurological: Positive for tremors and weakness.  Endo/Heme/Allergies: Negative.   Psychiatric/Behavioral: Positive  for depression and substance abuse (Opioid dependence). Negative for suicidal ideas, hallucinations and memory loss. The patient is nervous/anxious and has insomnia.     Blood pressure 98/59, pulse 90, temperature 98 F (36.7 C), temperature source Oral, resp. rate 16, height 5\' 1"  (1.549 m), weight 61.689 kg (136 lb), last menstrual period 09/12/2013, SpO2 98.00%.Body mass index is 25.71 kg/(m^2).  General Appearance: Casual  Eye Contact::  Fair  Speech:  Clear and Coherent  Volume:  Normal  Mood:  Anxious and Depressed  Affect:  Flat  Thought Process:  Coherent  Orientation:  Full (Time, Place, and Person)  Thought Content:  Rumination, tactile hallucinations  Suicidal Thoughts:  No  Homicidal Thoughts:  No  Memory:  Immediate;   Good Recent;   Good Remote;   Good  Judgement:  Fair  Insight:  Fair  Psychomotor Activity:  Restlessness  Concentration:  Fair  Recall:  Good  Fund of Knowledge:Fair  Language: Good  Akathisia:  No  Handed:  Right  AIMS (if indicated):     Assets:  Desire for Improvement  Sleep:  Number of Hours: 4.75   Musculoskeletal: Strength & Muscle Tone: within normal limits Gait & Station: normal Patient leans: N/A  Current Medications: Current Facility-Administered Medications  Medication Dose Route Frequency Provider Last Rate Last Dose  . acetaminophen (TYLENOL) tablet 650 mg  650 mg Oral Q6H PRN Evanna Glenda Chroman, NP      . alum & mag hydroxide-simeth (MAALOX/MYLANTA) 200-200-20 MG/5ML suspension 30 mL  30 mL Oral Q4H PRN Evanna Glenda Chroman, NP      . atomoxetine (STRATTERA) capsule 25 mg  25 mg Oral BID  WC Encarnacion Slates, NP   25 mg at 10/08/13 0830  . chlordiazePOXIDE (LIBRIUM) capsule 25 mg  25 mg Oral Once Encarnacion Slates, NP      . cloNIDine (CATAPRES) tablet 0.1 mg  0.1 mg Oral BH-qamhs Encarnacion Slates, NP   0.1 mg at 10/08/13 0830   Followed by  . [START ON 10/09/2013] cloNIDine (CATAPRES) tablet 0.1 mg  0.1 mg Oral QAC breakfast Encarnacion Slates,  NP      . dicyclomine (BENTYL) tablet 20 mg  20 mg Oral Q6H PRN Malena Peer, NP   20 mg at 10/05/13 1012  . dicyclomine (BENTYL) tablet 20 mg  20 mg Oral Q6H PRN Encarnacion Slates, NP   20 mg at 10/08/13 0835  . hydrOXYzine (ATARAX/VISTARIL) tablet 25 mg  25 mg Oral Q6H PRN Malena Peer, NP   25 mg at 10/08/13 1304  . influenza vac split quadrivalent PF (FLUARIX) injection 0.5 mL  0.5 mL Intramuscular Tomorrow-1000 Nicholaus Bloom, MD      . loperamide (IMODIUM) capsule 2-4 mg  2-4 mg Oral PRN Encarnacion Slates, NP   4 mg at 10/07/13 2241  . magnesium hydroxide (MILK OF MAGNESIA) suspension 30 mL  30 mL Oral Daily PRN Evanna Glenda Chroman, NP      . methocarbamol (ROBAXIN) tablet 500 mg  500 mg Oral Q8H PRN Encarnacion Slates, NP   500 mg at 10/07/13 2241  . multivitamin with minerals tablet 1 tablet  1 tablet Oral Daily Encarnacion Slates, NP   1 tablet at 10/08/13 0830  . naproxen (NAPROSYN) tablet 500 mg  500 mg Oral BID PRN Encarnacion Slates, NP   500 mg at 10/07/13 1959  . ondansetron (ZOFRAN-ODT) disintegrating tablet 4 mg  4 mg Oral Q6H PRN Malena Peer, NP   4 mg at 10/05/13 0647  . ondansetron (ZOFRAN-ODT) disintegrating tablet 4 mg  4 mg Oral Q6H PRN Encarnacion Slates, NP   4 mg at 10/07/13 2241  . QUEtiapine (SEROQUEL) tablet 100 mg  100 mg Oral QHS Encarnacion Slates, NP   100 mg at 10/07/13 2241  . thiamine (B-1) injection 100 mg  100 mg Intramuscular Once Encarnacion Slates, NP      . thiamine (VITAMIN B-1) tablet 100 mg  100 mg Oral Daily Encarnacion Slates, NP   100 mg at 10/08/13 0830    Lab Results: No results found for this or any previous visit (from the past 48 hour(s)).  Physical Findings: AIMS: Facial and Oral Movements Muscles of Facial Expression: None, normal Lips and Perioral Area: None, normal Jaw: None, normal Tongue: None, normal,Extremity Movements Upper (arms, wrists, hands, fingers): None, normal Lower (legs, knees, ankles, toes): None, normal, Trunk Movements Neck, shoulders,  hips: None, normal, Overall Severity Severity of abnormal movements (highest score from questions above): None, normal Incapacitation due to abnormal movements: None, normal Patient's awareness of abnormal movements (rate only patient's report): No Awareness, Dental Status Current problems with teeth and/or dentures?: No Does patient usually wear dentures?: No  CIWA:  CIWA-Ar Total: 4 COWS:  COWS Total Score: 4  Treatment Plan Summary: Daily contact with patient to assess and evaluate symptoms and progress in treatment Medication management  Plan: Review of chart, vital signs, medications, and notes. 1-Individual and group therapy 2-Medication management for depression and anxiety:  Medications reviewed with the patient, denies any adverse effects, continue Seroquel from 100 mg Q hs for mood  control and Strattera 25 mg bid for ADHD. 3-Coping skills for depression, anxiety, and substance dependency 4-Continue crisis stabilization and management 5-Address health issues--monitoring vital signs, stable 6-Treatment plan in progress to prevent relapse of depression, substance abuse, and anxiety  Medical Decision Making Problem Points:  Established problem, stable/improving (1), Review of last therapy session (1) and Review of psycho-social stressors (1) Data Points:  Review of medication regiment & side effects (2) Review of new medications or change in dosage (2)  I certify that inpatient services furnished can reasonably be expected to improve the patient's condition.   Encarnacion Slates, [place] 10/08/2013, 4:44 PM

## 2013-10-08 NOTE — BHH Group Notes (Signed)
[facility] LCSW Aftercare Discharge Planning Group Note   10/08/2013 10:56 AM  Participation Quality:  DID NOT ATTEND-pt sleeping in room.  Smart, [employer]

## 2013-10-08 NOTE — Progress Notes (Signed)
D) Pt has attended the program and is interacting with her peers appropriately. Denies SI and HI. Rates her depression at an 8 and her hopelessness at a 7. States her depression is over her situation with losing custody of her 34 year old and stopping her medications. A) Given support, reassurance and praise. Given encouragement to focus on the here and now and that she is detoxing by her choice. Praised R) Pt denies SI and HI

## 2013-10-08 NOTE — Tx Team (Signed)
Interdisciplinary Treatment Plan Update (Adult)  Date: 10/08/2013   Time Reviewed: 11:52 AM  Progress in Treatment:  Attending groups: No.  Participating in groups:  No.  Taking medication as prescribed: Yes  Tolerating medication: Yes  Family/Significant othe contact made: Not yet. SPE required for this pt.   Patient understands diagnosis: Yes, AEB seeking treatment for cocaine abuse, opiate/benzo detox, mood stabilization, and passive SI.  Discussing patient identified problems/goals with staff: Yes  Medical problems stabilized or resolved: Yes  Denies suicidal/homicidal ideation: Yes during self report.  Patient has not harmed self or Others: Yes  New problem(s) identified:  Discharge Plan or Barriers: Pt not attending d/c planning group. Pt hoping to get into [facility] house. CSW contacted facility yesterday-Monica (intake coordinator) out until Monday. CSW will call back Monday-no beds available until beginning of April. Pt also requesting referral for med management in [place] area.  Additional comments: Reason for Continuation of Hospitalization: Librium taper-withdrawals Clonidine-withdrawals Mood stabilization Medication management  Estimated length of stay: 3-4 days  For review of initial/current patient goals, please see plan of care.  Attendees:  Patient:    Family:    Physician:    Nursing: Jan RN 10/08/2013 11:52 AM   Clinical Social Worker [place], [place]  10/08/2013 11:52 AM   Other: [place] Coordinator  10/08/2013 11:52 AM   Other: Hardie Pulley. PA 10/08/2013 11:52 AM   Other: Gerline Legacy Nurse CM  10/08/2013 11:52 AM   Other:    Scribe for Treatment Team:  [place] LCSWA 10/08/2013 11:52 AM

## 2013-10-08 NOTE — Progress Notes (Signed)
Adult Psychoeducational Group Note  Date:  10/08/2013 Time:  10:00AM  Group Topic/Focus:  Relapse Prevention Planning:   The focus of this group is to define relapse and discuss the need for planning to combat relapse.  Participation Level:  Did Not Attend   Additional Comments:  Pt opted not to attend the group session.   Zoila Shutter R 10/08/2013, 10:42 AM

## 2013-10-08 NOTE — BHH Group Notes (Signed)
[facility] LCSW Group Therapy  10/08/2013 2:29 PM  Type of Therapy:  Group Therapy  Participation Level:  Minimal  Participation Quality:  Attentive and Drowsy  Affect:  Depressed, Flat and Lethargic  Cognitive:  Oriented  Insight:  Limited  Engagement in Therapy:  Limited  Modes of Intervention:  Confrontation, Discussion, Education, Exploration, Problem-solving, Rapport Building, Socialization and Support  Summary of Progress/Problems: Feelings around Relapse. Group members discussed the meaning of relapse and shared personal stories of relapse, how it affected them and others, and how they perceived themselves during this time. Group members were encouraged to identify triggers, warning signs and coping skills used when facing the possibility of relapse. Social supports were discussed and explored in detail. Post Acute Withdrawal Syndrome (handout provided) was introduced and examined. Pt's were encouraged to ask questions, talk about key points associated with PAWS, and process this information in terms of relapse prevention. [name] appeared drowsy but attempted to remain attentive throughout today's therapy group. She actively listened as others shared during group but did not actively participate in group discussion. [name] requested that CSW submit referral to [facility] on her behalf today.    Smart, Cheronda Erck LCSWA  10/08/2013, 2:29 PM

## 2013-10-08 NOTE — BHH Suicide Risk Assessment (Signed)
[facility] INPATIENT:  Family/Significant Other Suicide Prevention Education  Suicide Prevention Education:  Education Completed; Adline Mango (pt's sister) [phone] has been identified by the patient as the family member/significant other with whom the patient will be residing, and identified as the person(s) who will aid the patient in the event of a mental health crisis (suicidal ideations/suicide attempt).  With written consent from the patient, the family member/significant other has been provided the following suicide prevention education, prior to the and/or following the discharge of the patient.  The suicide prevention education provided includes the following:  Suicide risk factors  Suicide prevention and interventions  National Suicide Hotline telephone number  [facility] assessment telephone number  [facility] Emergency Assistance [place] and/or Residential Mobile Crisis Unit telephone number  Request made of family/significant other to:  Remove weapons (e.g., guns, rifles, knives), all items previously/currently identified as safety concern.    Remove drugs/medications (over-the-counter, prescriptions, illicit drugs), all items previously/currently identified as a safety concern.  The family member/significant other verbalizes understanding of the suicide prevention education information provided.  The family member/significant other agrees to remove the items of safety concern listed above.  Smart, Haydin Calandra LCSWA  10/08/2013, 3:28 PM

## 2013-10-09 MED ORDER — ATOMOXETINE HCL 25 MG PO CAPS
25.0000 mg | ORAL_CAPSULE | Freq: Once | ORAL | Status: AC
  Administered 2013-10-09: 25 mg via ORAL
  Filled 2013-10-09: qty 1

## 2013-10-09 MED ORDER — ATOMOXETINE HCL 25 MG PO CAPS
25.0000 mg | ORAL_CAPSULE | Freq: Two times a day (BID) | ORAL | Status: DC
  Administered 2013-10-10 – 2013-10-11 (×4): 25 mg via ORAL
  Filled 2013-10-09 (×8): qty 1

## 2013-10-09 MED ORDER — GABAPENTIN 100 MG PO CAPS
100.0000 mg | ORAL_CAPSULE | Freq: Three times a day (TID) | ORAL | Status: DC
  Administered 2013-10-09 – 2013-10-10 (×5): 100 mg via ORAL
  Filled 2013-10-09 (×9): qty 1

## 2013-10-09 NOTE — Progress Notes (Signed)
Adult Psychoeducational Group Note  Date:  10/09/2013 Time:  1315  Group Topic/Focus:  Relapse Prevention Planning:   The focus of this group is to define relapse and discuss the need for planning to combat relapse.  Participation Level:  Minimal  Participation Quality:  Appropriate and Attentive  Affect:  Appropriate  Cognitive:  Alert, Appropriate and Oriented  Insight: Improving  Engagement in Group:  Engaged  Modes of Intervention:  Discussion and Education  Additional Comments:  Pt spoke very little but she listened attentively  Hoyt Koch, [place] L[place] Leanna Sato 10/09/2013, 2:39 PM

## 2013-10-09 NOTE — Progress Notes (Signed)
D: Pt has been very flat and depressed on the unit today, she has been very isolative and has been in the bed all day. Pt would only get out of bed for medications, pt did not attend any groups and has not engaged in treatment. Pt reported on her self inventory sheet that her depression was a 8, and her helplessness/hopelessness was a 8. Pt reported being negative SI/HI, no AH/VH noted. A: 15 min checks continued for patient safety. R: Pt safety maintained.

## 2013-10-09 NOTE — Progress Notes (Signed)
Patient ID: [name], female   DOB: June 01, 1981, 33 y.o.   MRN: [id number] Patient ID: [name], female   DOB: [birth year], 33 y.o.   MRN: [id number] Patient ID: [name], female   DOB: 1981/04/05, 33 y.o.   MRN: [id number] [facility] MD Progress Note  10/09/2013 6:35 PM [name]  MRN:  [id number]  Subjective:  [name] reports that she is feeling a lot better. Wants her Strattera dosing time adjusted.  [name] is still very active today. Presents with good affects. Is out of bed, participating in groups, hanging out with the other patients.  Diagnosis:   DSM5: Schizophrenia Disorders:  NA Obsessive-Compulsive Disorders:  NA Trauma-Stressor Disorders:  NA Substance/Addictive Disorders:  Polysubstance (including opioids) dependence with physiological dependence  Depressive Disorders:  Substance induced mood disorder  Total Time spent with patient: 35 minutes  Axis I: Polysubstance (including opioids) dependence with physiological dependence Axis II: Deferred Axis III:  Past Medical History  Diagnosis Date  . Pinworms   . Depression   . Pituitary tumor    Axis IV: Polysubstance dependence Axis V: 41-50 serious symptoms  ADL's:  Intact  Sleep: Poor  Appetite:  Fair  Suicidal Ideation:  Plan:  Denies Intent:  Denies Means:  denies Homicidal Ideation:  Plan:  Denies Intent:  Denies Means:  Denies  AEB (as evidenced by):  Psychiatric Specialty Exam: Physical Exam  Review of Systems  Constitutional: Positive for malaise/fatigue.  HENT: Negative.   Eyes: Negative.   Cardiovascular: Negative.   Gastrointestinal: Negative.   Genitourinary: Negative.   Musculoskeletal: Positive for myalgias.  Skin: Negative.   Neurological: Positive for tremors and weakness.  Endo/Heme/Allergies: Negative.   Psychiatric/Behavioral: Positive for depression and substance abuse (Opioid dependence). Negative for suicidal ideas, hallucinations and memory loss.  The patient is nervous/anxious and has insomnia.     Blood pressure 92/51, pulse 70, temperature 97.3 F (36.3 C), temperature source Oral, resp. rate 16, height 5\' 1"  (1.549 m), weight 61.689 kg (136 lb), last menstrual period 09/12/2013, SpO2 98.00%.Body mass index is 25.71 kg/(m^2).  General Appearance: Casual  Eye Contact::  Fair  Speech:  Clear and Coherent  Volume:  Normal  Mood:  Anxious and Depressed  Affect:  Flat  Thought Process:  Coherent  Orientation:  Full (Time, Place, and Person)  Thought Content:  Rumination, tactile hallucinations  Suicidal Thoughts:  No  Homicidal Thoughts:  No  Memory:  Immediate;   Good Recent;   Good Remote;   Good  Judgement:  Fair  Insight:  Fair  Psychomotor Activity:  Restlessness  Concentration:  Fair  Recall:  Good  Fund of Knowledge:Fair  Language: Good  Akathisia:  No  Handed:  Right  AIMS (if indicated):     Assets:  Desire for Improvement  Sleep:  Number of Hours: 5.75   Musculoskeletal: Strength & Muscle Tone: within normal limits Gait & Station: normal Patient leans: N/A  Current Medications: Current Facility-Administered Medications  Medication Dose Route Frequency Provider Last Rate Last Dose  . acetaminophen (TYLENOL) tablet 650 mg  650 mg Oral Q6H PRN Malena Peer, NP   650 mg at 10/08/13 1834  . alum & mag hydroxide-simeth (MAALOX/MYLANTA) 200-200-20 MG/5ML suspension 30 mL  30 mL Oral Q4H PRN Evanna Glenda Chroman, NP      . Derrill Memo ON 10/10/2013] atomoxetine (STRATTERA) capsule 25 mg  25 mg Oral BID BM Encarnacion Slates, NP      . chlordiazePOXIDE (LIBRIUM) capsule  25 mg  25 mg Oral Once Encarnacion Slates, NP      . cloNIDine (CATAPRES) tablet 0.1 mg  0.1 mg Oral QAC breakfast Encarnacion Slates, NP      . dicyclomine (BENTYL) tablet 20 mg  20 mg Oral Q6H PRN Malena Peer, NP   20 mg at 10/05/13 1012  . dicyclomine (BENTYL) tablet 20 mg  20 mg Oral Q6H PRN Encarnacion Slates, NP   20 mg at 10/09/13 0810  . gabapentin  (NEURONTIN) capsule 100 mg  100 mg Oral TID Encarnacion Slates, NP   100 mg at 10/09/13 1706  . hydrOXYzine (ATARAX/VISTARIL) tablet 25 mg  25 mg Oral Q6H PRN Malena Peer, NP   25 mg at 10/09/13 0809  . influenza vac split quadrivalent PF (FLUARIX) injection 0.5 mL  0.5 mL Intramuscular Tomorrow-1000 Nicholaus Bloom, MD      . loperamide (IMODIUM) capsule 2-4 mg  2-4 mg Oral PRN Encarnacion Slates, NP   4 mg at 10/07/13 2241  . magnesium hydroxide (MILK OF MAGNESIA) suspension 30 mL  30 mL Oral Daily PRN Evanna Glenda Chroman, NP      . methocarbamol (ROBAXIN) tablet 500 mg  500 mg Oral Q8H PRN Encarnacion Slates, NP   500 mg at 10/09/13 1706  . multivitamin with minerals tablet 1 tablet  1 tablet Oral Daily Encarnacion Slates, NP   1 tablet at 10/09/13 6834  . naproxen (NAPROSYN) tablet 500 mg  500 mg Oral BID PRN Encarnacion Slates, NP   500 mg at 10/09/13 0810  . ondansetron (ZOFRAN-ODT) disintegrating tablet 4 mg  4 mg Oral Q6H PRN Malena Peer, NP   4 mg at 10/05/13 0647  . ondansetron (ZOFRAN-ODT) disintegrating tablet 4 mg  4 mg Oral Q6H PRN Encarnacion Slates, NP   4 mg at 10/07/13 2241  . QUEtiapine (SEROQUEL) tablet 100 mg  100 mg Oral QHS Encarnacion Slates, NP   100 mg at 10/08/13 2221  . thiamine (B-1) injection 100 mg  100 mg Intramuscular Once Encarnacion Slates, NP      . thiamine (VITAMIN B-1) tablet 100 mg  100 mg Oral Daily Encarnacion Slates, NP   100 mg at 10/09/13 1962    Lab Results: No results found for this or any previous visit (from the past 48 hour(s)).  Physical Findings: AIMS: Facial and Oral Movements Muscles of Facial Expression: None, normal Lips and Perioral Area: None, normal Jaw: None, normal Tongue: None, normal,Extremity Movements Upper (arms, wrists, hands, fingers): None, normal Lower (legs, knees, ankles, toes): None, normal, Trunk Movements Neck, shoulders, hips: None, normal, Overall Severity Severity of abnormal movements (highest score from questions above): None,  normal Incapacitation due to abnormal movements: None, normal Patient's awareness of abnormal movements (rate only patient's report): No Awareness, Dental Status Current problems with teeth and/or dentures?: No Does patient usually wear dentures?: No  CIWA:  CIWA-Ar Total: 2 COWS:  COWS Total Score: 4  Treatment Plan Summary: Daily contact with patient to assess and evaluate symptoms and progress in treatment Medication management  Plan: Review of chart, vital signs, medications, and notes. 1-Individual and group therapy 2-Medication management for depression and anxiety:  Medications reviewed with the patient, denies any adverse effects, continue Seroquel from 100 mg Q hs for mood control and Strattera 25 mg bid for ADHD. 3-Coping skills for depression, anxiety, and substance dependency 4-Continue crisis stabilization and management 5-Address health issues--monitoring  vital signs, stable 6-Treatment plan in progress to prevent relapse of depression, substance abuse, and anxiety  Medical Decision Making Problem Points:  Established problem, stable/improving (1), Review of last therapy session (1) and Review of psycho-social stressors (1) Data Points:  Review of medication regiment & side effects (2) Review of new medications or change in dosage (2)  I certify that inpatient services furnished can reasonably be expected to improve the patient's condition.   Encarnacion Slates, [place] 10/09/2013, 6:35 PM  Attending Addendum: I have discussed this patient with the above provider. I have reviewed the history, physical exam, assessment and plan and agree with the above.  Coralyn Helling, M.D.  10/09/2013 8:57 PM

## 2013-10-09 NOTE — BHH Group Notes (Signed)
[place] Group Notes:  (Nursing/MHT/Case Management/Adjunct)  Date:  10/09/2013  Time:  10:44 AM  Psychoeducational Group Note  Group Topic/Focus:  Making Healthy Choices:   The focus of this group is to help patients identify negative/unhealthy choices they were using prior to admission and identify positive/healthier coping strategies to replace them upon discharge.  Participation Level: Did Not Attend  Participation Quality:  Not Applicable  Affect:  Not Applicable  Cognitive:  Not Applicable  Insight:  Not Applicable  Engagement in Group: Not Applicable  Additional Comments: Pt did not attend group.   Benancio Deeds Shanta 10/09/2013, 10:44 AM Shea Evans, Quentin Angst 10/09/2013, 10:44 AM

## 2013-10-09 NOTE — Progress Notes (Signed)
[place] Group Notes:  (Nursing/MHT/Case Management/Adjunct)  Date:  10/09/2013  Time:  3:15PM  Type of Therapy:  Psychoeducational Skills  Participation Level:  Active  Participation Quality:  Appropriate and Attentive  Affect:  Appropriate  Cognitive:  Appropriate  Insight:  Appropriate  Engagement in Group:  Engaged and Supportive  Modes of Intervention:  Activity  Summary of Progress/Problems: Pts played a game of Pictionary using different coping skills. Pts were asked one coping skill they have learned while here at the hospital. Pt identified a coping skill of reading.  Clint Bolder 10/09/2013, 6:50 PM

## 2013-10-09 NOTE — Progress Notes (Signed)
D: pt is minimal on contact and forwards little. Pt c/o of withdrawal symptoms tremors, HA, anxiety, and chills. Denies si/hi/avh. Pt is calm and cooperative. Flat affect. Anxious mood. A: vistaril given for anxiety. Support and encouragement offered. q 15 min safety checks. R: pt remains safe on unit. No further complaints or signs of distress at this time

## 2013-10-09 NOTE — [facility] Group Notes (Signed)
[place] LCSW Group Therapy  10/09/2013 10:00 AM  Type of Therapy:  Group Therapy  Participation Level:  None  Participation Quality:  Attentive  Affect:  Flat  Cognitive:  Alert  Insight:  None shared  Engagement in Therapy:  None, came in for only last 10 minutes  Modes of Intervention:  Clarification, Discussion, Exploration, Rapport Building, Socialization and Support  Summary of Progress/Problems: The main focus of today's process group was for the patient to identify ways in which they have in the past sabotaged their own recovery. Motivational Interviewing was utilized to ask the group members what they get out of their substance use, and what reasons they may have for wanting to change. The Stages of Change were explained using a handout, and patients identified where they currently are with regard to stages of change. [name] was unable to identify where she is in stage of change model.  [name]

## 2013-10-10 MED ORDER — GABAPENTIN 300 MG PO CAPS
300.0000 mg | ORAL_CAPSULE | Freq: Three times a day (TID) | ORAL | Status: DC
  Administered 2013-10-11 – 2013-10-14 (×11): 300 mg via ORAL
  Filled 2013-10-10 (×5): qty 1
  Filled 2013-10-10: qty 42
  Filled 2013-10-10 (×2): qty 1
  Filled 2013-10-10: qty 42
  Filled 2013-10-10 (×6): qty 1
  Filled 2013-10-10: qty 42
  Filled 2013-10-10: qty 1

## 2013-10-10 MED ORDER — METHOCARBAMOL 500 MG PO TABS
500.0000 mg | ORAL_TABLET | Freq: Three times a day (TID) | ORAL | Status: DC | PRN
  Administered 2013-10-10 – 2013-10-14 (×8): 500 mg via ORAL
  Filled 2013-10-10 (×9): qty 1

## 2013-10-10 MED ORDER — SERTRALINE HCL 50 MG PO TABS
ORAL_TABLET | ORAL | Status: AC
  Filled 2013-10-10: qty 1

## 2013-10-10 MED ORDER — SERTRALINE HCL 50 MG PO TABS
50.0000 mg | ORAL_TABLET | Freq: Every day | ORAL | Status: DC
  Administered 2013-10-10 – 2013-10-11 (×2): 50 mg via ORAL
  Filled 2013-10-10 (×4): qty 1

## 2013-10-10 NOTE — Progress Notes (Signed)
[place] Group Notes:  (Nursing/MHT/Case Management/Adjunct)  Date:  10/10/2013  Time:  3:15 PM  Type of Therapy:  Psychoeducational Skills  Participation Level:  Active  Participation Quality:  Appropriate, Attentive and Supportive  Affect:  Appropriate  Cognitive:  Appropriate  Insight:  Appropriate  Engagement in Group:  Engaged and Supportive  Modes of Intervention:  Activity  Summary of Progress/Problems: Pts played a game using the Therapeutic Activity Ball. Pts were asked one thing they have learned while at the hospital. Pt stated she has learned that she has a problem and she needs to get control of it.  Clint Bolder 10/10/2013, 6:29 PM

## 2013-10-10 NOTE — [facility] Group Notes (Signed)
[place] LCSW Group Therapy  10/10/2013 10:00 AM  Type of Therapy:  Group Therapy  Participation Level:  Minimal  Participation Quality:  Attentive  Affect:  Flat  Cognitive:  Alert  Insight:  None shared  Engagement in Therapy:  Limited  Modes of Intervention:  Discussion, Exploration, Socialization and Support  Summary of Progress/Problems:   Summary of Progress/Problems: The main focus of today's process group was to identify the patient's current support system and decide on other supports that can be put in place. An emphasis was placed on using counselor, doctor, therapy groups, 12-step groups, and problem-specific support groups to expand supports. There was also an extensive discussion about what constitutes a healthy support versus an unhealthy support.  Kacee choose not to participate in discussion following introductions. During introduction she did state she is looking forward to reconnecting with her children this year.   [name]

## 2013-10-10 NOTE — Progress Notes (Signed)
Pt observed up and around milieu this evening. Walking the halls with female peer. Showered after group. Continues to complain of abdominal cramping, bilat leg cramping and anxiety for which she was medicated. Support and encouragement offered. On reassess, pt is asleep. She denies SI/HI/AVH and remains safe .Jamie Kato

## 2013-10-10 NOTE — [facility] Group Notes (Signed)
[facility] Group Notes:  (Nursing/MHT/Case Management/Adjunct)  Date:  10/10/2013  Time:  11:01 AM  Type of Therapy:  Psychoeducational Skills  Participation Level:  Active  Participation Quality:  Appropriate  Affect:  Appropriate  Cognitive:  Appropriate  Insight:  Appropriate  Engagement in Group:  Engaged  Modes of Intervention:  Discussion  Summary of Progress/Problems: Pt did attend self inventory group, pt reported that she was negative SI/HI, no AH/VH noted. Pt rated her depression as a 8, and her helplessness/hopelessness as a 8.     Benancio Deeds Shanta 10/10/2013, 11:01 AM

## 2013-10-10 NOTE — Progress Notes (Signed)
[facility] Group Notes:  (Nursing/MHT/Case Management/Adjunct)  Date:  10/10/2013  Time:  2100  Type of Therapy:  wrap up group  Participation Level:  Minimal  Participation Quality:  Appropriate, Attentive, Sharing and Supportive  Affect:  Depressed  Cognitive:  Appropriate  Insight:  Appropriate  Engagement in Group:  Engaged  Modes of Intervention:  Clarification, Education and Support  Summary of Progress/Problems: Pt reported her withdrawals much better today. Pt was just missing talking to her child whom is staying with her sister.  Pt plans on attending [facility] and then transitioning to a halfway house in [place].   Jacques Navy 10/10/2013, 10:26 PM

## 2013-10-10 NOTE — Progress Notes (Signed)
Patient ID: KAZOUA GOSSEN, female   DOB: 1981/02/15, 33 y.o.   MRN: [id number] Patient ID: OTHELLA SLAPPEY, female   DOB: [birth year], 33 y.o.   MRN: [id number] Patient ID: CHANEL MCADAMS, female   DOB: 16-Oct-1980, 33 y.o.   MRN: [id number] Patient ID: FLOYE FESLER, female   DOB: March 30, 1981, 33 y.o.   MRN: [id number] [facility] MD Progress Note  10/10/2013 5:10 PM TEMESHA QUEENER  MRN:  [id number]  Subjective:  [name] reports that she is feeling she is feeling more depressed today. She says she has been crying a lot since last night. Complained of withdrawal symptoms still. Requested increase in Neurontin as it helps her nerve pains.  Jenetta DownerCaryl Pina remains very active today. Presents with good affects. Is out of bed, participating in groups, hanging out with the other patients. However is endorsing signs and symptoms of depression and withdrawal symptoms. Her affect and mannerism appears different from how patient is describing she is feeling.  Diagnosis:   DSM5: Schizophrenia Disorders:  NA Obsessive-Compulsive Disorders:  NA Trauma-Stressor Disorders:  NA Substance/Addictive Disorders:  Polysubstance (including opioids) dependence with physiological dependence  Depressive Disorders:  Substance induced mood disorder  Total Time spent with patient: 35 minutes  Axis I: Polysubstance (including opioids) dependence with physiological dependence Axis II: Deferred Axis III:  Past Medical History  Diagnosis Date  . Pinworms   . Depression   . Pituitary tumor    Axis IV: Polysubstance dependence Axis V: 41-50 serious symptoms  ADL's:  Intact  Sleep: Poor  Appetite:  Fair  Suicidal Ideation:  Plan:  Denies Intent:  Denies Means:  denies Homicidal Ideation:  Plan:  Denies Intent:  Denies Means:  Denies  AEB (as evidenced by):  Psychiatric Specialty Exam: Physical Exam  Review of Systems  Constitutional: Positive for malaise/fatigue.  HENT: Negative.   Eyes: Negative.    Cardiovascular: Negative.   Gastrointestinal: Negative.   Genitourinary: Negative.   Musculoskeletal: Positive for myalgias.  Skin: Negative.   Neurological: Positive for tremors and weakness.  Endo/Heme/Allergies: Negative.   Psychiatric/Behavioral: Positive for depression and substance abuse (Opioid dependence). Negative for suicidal ideas, hallucinations and memory loss. The patient is nervous/anxious and has insomnia.     Blood pressure 104/68, pulse 83, temperature 98.1 F (36.7 C), temperature source Oral, resp. rate 18, height 5\' 1"  (1.549 m), weight 61.689 kg (136 lb), last menstrual period 09/12/2013, SpO2 98.00%.Body mass index is 25.71 kg/(m^2).  General Appearance: Casual  Eye Contact::  Fair  Speech:  Clear and Coherent  Volume:  Normal  Mood:  Anxious and Depressed  Affect:  Flat  Thought Process:  Coherent  Orientation:  Full (Time, Place, and Person)  Thought Content:  Rumination, tactile hallucinations  Suicidal Thoughts:  No  Homicidal Thoughts:  No  Memory:  Immediate;   Good Recent;   Good Remote;   Good  Judgement:  Fair  Insight:  Fair  Psychomotor Activity:  Restlessness  Concentration:  Fair  Recall:  Good  Fund of Knowledge:Fair  Language: Good  Akathisia:  No  Handed:  Right  AIMS (if indicated):     Assets:  Desire for Improvement  Sleep:  Number of Hours: 6.25   Musculoskeletal: Strength & Muscle Tone: within normal limits Gait & Station: normal Patient leans: N/A  Current Medications: Current Facility-Administered Medications  Medication Dose Route Frequency Provider Last Rate Last Dose  . acetaminophen (TYLENOL) tablet 650 mg  650 mg Oral Q6H PRN Evanna Glenda Chroman,  NP   650 mg at 10/08/13 1834  . alum & mag hydroxide-simeth (MAALOX/MYLANTA) 200-200-20 MG/5ML suspension 30 mL  30 mL Oral Q4H PRN Evanna Glenda Chroman, NP      . atomoxetine (STRATTERA) capsule 25 mg  25 mg Oral BID BM Encarnacion Slates, NP   25 mg at 10/10/13 1357  .  chlordiazePOXIDE (LIBRIUM) capsule 25 mg  25 mg Oral Once Encarnacion Slates, NP      . cloNIDine (CATAPRES) tablet 0.1 mg  0.1 mg Oral QAC breakfast Encarnacion Slates, NP   0.1 mg at 10/10/13 0750  . [START ON 10/11/2013] gabapentin (NEURONTIN) capsule 300 mg  300 mg Oral TID Encarnacion Slates, NP      . hydrOXYzine (ATARAX/VISTARIL) tablet 25 mg  25 mg Oral Q6H PRN Malena Peer, NP   25 mg at 10/10/13 1206  . influenza vac split quadrivalent PF (FLUARIX) injection 0.5 mL  0.5 mL Intramuscular Tomorrow-1000 Nicholaus Bloom, MD      . magnesium hydroxide (MILK OF MAGNESIA) suspension 30 mL  30 mL Oral Daily PRN Evanna Glenda Chroman, NP      . methocarbamol (ROBAXIN) tablet 500 mg  500 mg Oral Q8H PRN Encarnacion Slates, NP      . multivitamin with minerals tablet 1 tablet  1 tablet Oral Daily Encarnacion Slates, NP   1 tablet at 10/10/13 0750  . QUEtiapine (SEROQUEL) tablet 100 mg  100 mg Oral QHS Encarnacion Slates, NP   100 mg at 10/09/13 2252  . sertraline (ZOLOFT) tablet 50 mg  50 mg Oral Daily Encarnacion Slates, NP      . thiamine (B-1) injection 100 mg  100 mg Intramuscular Once Encarnacion Slates, NP      . thiamine (VITAMIN B-1) tablet 100 mg  100 mg Oral Daily Encarnacion Slates, NP   100 mg at 10/10/13 0750    Lab Results: No results found for this or any previous visit (from the past 48 hour(s)).  Physical Findings: AIMS: Facial and Oral Movements Muscles of Facial Expression: None, normal Lips and Perioral Area: None, normal Jaw: None, normal Tongue: None, normal,Extremity Movements Upper (arms, wrists, hands, fingers): None, normal Lower (legs, knees, ankles, toes): None, normal, Trunk Movements Neck, shoulders, hips: None, normal, Overall Severity Severity of abnormal movements (highest score from questions above): None, normal Incapacitation due to abnormal movements: None, normal Patient's awareness of abnormal movements (rate only patient's report): No Awareness, Dental Status Current problems with teeth  and/or dentures?: No Does patient usually wear dentures?: No  CIWA:  CIWA-Ar Total: 0 COWS:  COWS Total Score: 4  Treatment Plan Summary: Daily contact with patient to assess and evaluate symptoms and progress in treatment Medication management  Plan: Review of chart, vital signs, medications, and notes. 1-Individual and group therapy 2-Medication management for depression and anxiety:  Medications reviewed with the patient, denies any adverse effects, continue Seroquel from 100 mg Q hs for mood control and Strattera 25 mg bid for ADHD, increased Neurontin from 100 mg to 300 mg tid. Add Sertraline 50 mg daily for depression.. 3-Coping skills for depression, anxiety, and substance dependency 4-Continue crisis stabilization and management 5-Address health issues--monitoring vital signs, stable 6-Treatment plan in progress to prevent relapse of depression, substance abuse, and anxiety  Medical Decision Making Problem Points:  Established problem, stable/improving (1), Review of last therapy session (1) and Review of psycho-social stressors (1) Data Points:  Review of medication regiment &  side effects (2) Review of new medications or change in dosage (2)  I certify that inpatient services furnished can reasonably be expected to improve the patient's condition.   Encarnacion Slates, [place] 10/10/2013, 5:10 PM  Attending Addendum: I have discussed this patient with the above provider. I have reviewed the history, physical exam, assessment and plan and agree with the above.  Coralyn Helling, M.D.  10/10/2013 9:42 PM

## 2013-10-10 NOTE — Progress Notes (Signed)
D: Pt continues to be very flat and depressed on the unit, she continues to be very isolative and has been in the bed all day. Pt did attempt to attend groups today, but had very little participation.  Pt reported on her self inventory sheet that her depression was a 8, and her helplessness/hopelessness was a 8. Pt reported being negative SI/HI, no AH/VH noted. A: 15 min checks continued for patient safety. R: Pt safety maintained.

## 2013-10-11 MED ORDER — SERTRALINE HCL 100 MG PO TABS
100.0000 mg | ORAL_TABLET | Freq: Every day | ORAL | Status: DC
  Administered 2013-10-12 – 2013-10-14 (×3): 100 mg via ORAL
  Filled 2013-10-11: qty 1
  Filled 2013-10-11: qty 14
  Filled 2013-10-11 (×3): qty 1

## 2013-10-11 MED ORDER — QUETIAPINE FUMARATE 50 MG PO TABS
150.0000 mg | ORAL_TABLET | Freq: Every day | ORAL | Status: DC
  Administered 2013-10-11: 150 mg via ORAL
  Filled 2013-10-11 (×4): qty 1

## 2013-10-11 MED ORDER — ATOMOXETINE HCL 25 MG PO CAPS
50.0000 mg | ORAL_CAPSULE | Freq: Every day | ORAL | Status: DC
  Administered 2013-10-12 – 2013-10-14 (×3): 50 mg via ORAL
  Filled 2013-10-11: qty 2
  Filled 2013-10-11: qty 28
  Filled 2013-10-11 (×3): qty 2

## 2013-10-11 NOTE — Progress Notes (Signed)
Patient ID: [name], female   DOB: 23-Apr-1981, 33 y.o.   MRN: [id number] She has been up and to groups interacting with peers and staff. Self inventory this AM: energy low, poor sleep, withdrawals of mild tremor, chilling and sweats, denies SI questions. She has requested and received prn medications for muscle cramps , anxiety and stomach pain.

## 2013-10-11 NOTE — Progress Notes (Signed)
Pt remains flat, anxious and depressed. Continues to complain of abdominal and bilat leg cramping. Tearful at times especially after speaking with her 34 year old via phone though acknowledges this is motivation to remain clean. Pt given support and reassurance. Medicated per orders and given robaxin and vistaril prn. On reassess, pt reports some relief. She denies SI/HI/AVH and remains safe. Jamie Kato

## 2013-10-11 NOTE — [facility] Group Notes (Signed)
[facility] LCSW Aftercare Discharge Planning Group Note   10/11/2013 9:48 AM  Participation Quality:  Appropriate   Mood/Affect:  Depressed and Flat  Depression Rating:  6  Anxiety Rating:  6  Thoughts of Suicide:  No Will you contract for safety?   N/a   Current AVH:  No  Plan for Discharge/Comments:  Pt reports that she is not happy about her meds-would like Seroquel/strattera increased, as she states that they are not working effectively. Pt reports poor sleep and mild withdrawals. She is hoping for admission to [facility] house-[facility] will be contacted today by CSW. [facility] referral submitted on Friday.   Transportation Means: unknown at this time.   Supports: some family Pharmacologist, Research officer, trade union

## 2013-10-11 NOTE — Progress Notes (Signed)
Patient ID: [name], female   DOB: October 03, 1980, 33 y.o.   MRN: [id number] Patient ID: [name], female   DOB: [birth year], 33 y.o.   MRN: [id number] Patient ID: [name], female   DOB: 01/13/1981, 33 y.o.   MRN: [id number] Patient ID: [name], female   DOB: February 16, 1981, 33 y.o.   MRN: [id number] Patient ID: [name], female   DOB: 1981-07-18, 33 y.o.   MRN: [id number] [facility] MD Progress Note  10/11/2013 3:43 PM [name]  MRN:  [id number]  Subjective:  [name] reports that she is feeling more depressed today. She says she has been crying a lot because of her children. Complained of withdrawal symptoms still. Requested increase in depression as it helps her nerve pains.  [name] continue. Presents with good affects. Is out of bed, participating in groups, hanging out with the other patients. However is endorsing signs and symptoms of worsening depression and withdrawal symptoms. Her affect and mannerism appears different from how patient is describing she is feeling. Adjusted medication doses.  Diagnosis:   DSM5: Schizophrenia Disorders:  NA Obsessive-Compulsive Disorders:  NA Trauma-Stressor Disorders:  NA Substance/Addictive Disorders:  Polysubstance (including opioids) dependence with physiological dependence  Depressive Disorders:  Substance induced mood disorder  Total Time spent with patient: 35 minutes  Axis I: Polysubstance (including opioids) dependence with physiological dependence Axis II: Deferred Axis III:  Past Medical History  Diagnosis Date  . Pinworms   . Depression   . Pituitary tumor    Axis IV: Polysubstance dependence Axis V: 41-50 serious symptoms  ADL's:  Intact  Sleep: Poor  Appetite:  Fair  Suicidal Ideation:  Plan:  Denies Intent:  Denies Means:  denies Homicidal Ideation:  Plan:  Denies Intent:  Denies Means:  Denies  AEB (as evidenced by):  Psychiatric Specialty Exam: Physical Exam  Review of  Systems  Constitutional: Positive for malaise/fatigue.  HENT: Negative.   Eyes: Negative.   Cardiovascular: Negative.   Gastrointestinal: Negative.   Genitourinary: Negative.   Musculoskeletal: Positive for myalgias.  Skin: Negative.   Neurological: Positive for tremors and weakness.  Endo/Heme/Allergies: Negative.   Psychiatric/Behavioral: Positive for depression and substance abuse (Opioid dependence). Negative for suicidal ideas, hallucinations and memory loss. The patient is nervous/anxious and has insomnia.     Blood pressure 119/76, pulse 79, temperature 97.9 F (36.6 C), temperature source Oral, resp. rate 16, height 5\' 1"  (1.549 m), weight 61.689 kg (136 lb), last menstrual period 09/12/2013, SpO2 98.00%.Body mass index is 25.71 kg/(m^2).  General Appearance: Casual  Eye Contact::  Fair  Speech:  Clear and Coherent  Volume:  Normal  Mood:  Anxious and Depressed  Affect:  Flat  Thought Process:  Coherent  Orientation:  Full (Time, Place, and Person)  Thought Content:  Rumination, tactile hallucinations  Suicidal Thoughts:  No  Homicidal Thoughts:  No  Memory:  Immediate;   Good Recent;   Good Remote;   Good  Judgement:  Fair  Insight:  Fair  Psychomotor Activity:  Restlessness  Concentration:  Fair  Recall:  Good  Fund of Knowledge:Fair  Language: Good  Akathisia:  No  Handed:  Right  AIMS (if indicated):     Assets:  Desire for Improvement  Sleep:  Number of Hours: 5   Musculoskeletal: Strength & Muscle Tone: within normal limits Gait & Station: normal Patient leans: N/A  Current Medications: Current Facility-Administered Medications  Medication Dose Route Frequency Provider Last Rate Last Dose  .  acetaminophen (TYLENOL) tablet 650 mg  650 mg Oral Q6H PRN Malena Peer, NP   650 mg at 10/11/13 1518  . alum & mag hydroxide-simeth (MAALOX/MYLANTA) 200-200-20 MG/5ML suspension 30 mL  30 mL Oral Q4H PRN Evanna Glenda Chroman, NP      . Derrill Memo ON 10/12/2013]  atomoxetine (STRATTERA) capsule 50 mg  50 mg Oral Daily Encarnacion Slates, NP      . chlordiazePOXIDE (LIBRIUM) capsule 25 mg  25 mg Oral Once Encarnacion Slates, NP      . gabapentin (NEURONTIN) capsule 300 mg  300 mg Oral TID Encarnacion Slates, NP   300 mg at 10/11/13 1150  . hydrOXYzine (ATARAX/VISTARIL) tablet 25 mg  25 mg Oral Q6H PRN Malena Peer, NP   25 mg at 10/11/13 1520  . influenza vac split quadrivalent PF (FLUARIX) injection 0.5 mL  0.5 mL Intramuscular Tomorrow-1000 Nicholaus Bloom, MD      . magnesium hydroxide (MILK OF MAGNESIA) suspension 30 mL  30 mL Oral Daily PRN Evanna Glenda Chroman, NP      . methocarbamol (ROBAXIN) tablet 500 mg  500 mg Oral Q8H PRN Encarnacion Slates, NP   500 mg at 10/11/13 0827  . multivitamin with minerals tablet 1 tablet  1 tablet Oral Daily Encarnacion Slates, NP   1 tablet at 10/11/13 [phone]  . QUEtiapine (SEROQUEL) tablet 150 mg  150 mg Oral QHS Encarnacion Slates, NP      . Derrill Memo ON 10/12/2013] sertraline (ZOLOFT) tablet 100 mg  100 mg Oral Daily Encarnacion Slates, NP      . thiamine (B-1) injection 100 mg  100 mg Intramuscular Once Encarnacion Slates, NP      . thiamine (VITAMIN B-1) tablet 100 mg  100 mg Oral Daily Encarnacion Slates, NP   100 mg at 10/11/13 0825    Lab Results: No results found for this or any previous visit (from the past 84 hour(s)).  Physical Findings: AIMS: Facial and Oral Movements Muscles of Facial Expression: None, normal Lips and Perioral Area: None, normal Jaw: None, normal Tongue: None, normal,Extremity Movements Upper (arms, wrists, hands, fingers): None, normal Lower (legs, knees, ankles, toes): None, normal, Trunk Movements Neck, shoulders, hips: None, normal, Overall Severity Severity of abnormal movements (highest score from questions above): None, normal Incapacitation due to abnormal movements: None, normal Patient's awareness of abnormal movements (rate only patient's report): No Awareness, Dental Status Current problems with teeth and/or  dentures?: No Does patient usually wear dentures?: No  CIWA:  CIWA-Ar Total: 0 COWS:  COWS Total Score: 4  Treatment Plan Summary: Daily contact with patient to assess and evaluate symptoms and progress in treatment Medication management  Plan: Review of chart, vital signs, medications, and notes. 1-Individual and group therapy 2-Medication management for depression and anxiety:  Medications reviewed with the patient, denies any adverse effects, Increased Seroquel to150 mg Q hs for mood control changed Strattera dosing from 25 mg bid to 50 mg daily for ADHD, Continue Neurontin at 300 mg tid. Increased Sertraline to 100 mg daily for depression.. 3-Coping skills for depression, anxiety, and substance dependency 4-Continue crisis stabilization and management 5-Address health issues--monitoring vital signs, stable 6-Treatment plan in progress to prevent relapse of depression, substance abuse, and anxiety  Medical Decision Making Problem Points:  Established problem, stable/improving (1), Review of last therapy session (1) and Review of psycho-social stressors (1) Data Points:  Review of medication regiment & side effects (2) Review  of new medications or change in dosage (2)  I certify that inpatient services furnished can reasonably be expected to improve the patient's condition.   Encarnacion Slates, [place] 10/11/2013, 3:43 PM  Attending Addendum: I have discussed this patient with the above provider. I have reviewed the history, physical exam, assessment and plan and agree with the above.  Coralyn Helling, M.D.  10/11/2013 3:43 PM

## 2013-10-11 NOTE — [facility] Group Notes (Signed)
[place] LCSW Group Therapy  10/11/2013 3:20 PM  Type of Therapy:  Group Therapy  Participation Level:  Minimal   Participation Quality:  Attentive  Affect:  Depressed and Tearful  Cognitive:  Alert and Oriented  Insight:  Limited  Engagement in Therapy:  Limited  Modes of Intervention:  Confrontation, Discussion, Education, Exploration, Problem-solving, Rapport Building, Socialization and Support  Summary of Progress/Problems: Today's Topic: Overcoming Obstacles. Pt identified obstacles faced currently and processed barriers involved in overcoming these obstacles. Pt identified steps necessary for overcoming these obstacles and explored motivation (internal and external) for facing these difficulties head on. Pt further identified one area of concern in their lives and chose a skill of focus pulled from their "toolbox." Cj was attentive throughout today's therapy group. She did not participate in group discussion unless called upon by CSW. Georgene shared that cravings are an obstacle for her but she is planning to followup with inpatient treatment at [facility] and/or [facility] to assist her with maintaining sobriety. Destyn shows some progress in the group setting and improving insight AEB her ability to actively listen as others shared in the group setting. Anadelia is observably uncomfortable speaking in the group setting at this time.   Smart, Laurella Tull LCSWA 10/11/2013, 3:20 PM

## 2013-10-11 NOTE — Progress Notes (Signed)
Adult Psychoeducational Group Note  Date:  10/11/2013 Time:  11:14 AM  Group Topic/Focus:  Wellness Toolbox:   The focus of this group is to discuss various aspects of wellness, balancing those aspects and exploring ways to increase the ability to experience wellness.  Patients will create a wellness toolbox for use upon discharge.  Participation Level:  Active  Participation Quality:  Appropriate  Affect:  Appropriate  Cognitive:  Appropriate  Insight: Good  Engagement in Group:  Engaged  Modes of Intervention:  Discussion  Additional Comments:    Hollis Tuller M 10/11/2013, 11:14 AM 

## 2013-10-12 DIAGNOSIS — F1920 Other psychoactive substance dependence, uncomplicated: Principal | ICD-10-CM

## 2013-10-12 MED ORDER — QUETIAPINE FUMARATE 50 MG PO TABS
150.0000 mg | ORAL_TABLET | Freq: Every day | ORAL | Status: DC
  Administered 2013-10-12: 150 mg via ORAL
  Filled 2013-10-12 (×2): qty 3

## 2013-10-12 MED ORDER — TUBERCULIN PPD 5 UNIT/0.1ML ID SOLN
5.0000 [IU] | Freq: Once | INTRADERMAL | Status: AC
  Administered 2013-10-12: 5 [IU] via INTRADERMAL
  Filled 2013-10-12: qty 0.1

## 2013-10-12 NOTE — Progress Notes (Signed)
Patient ID: [name], female   DOB: 1981-02-25, 33 y.o.   MRN: [id number] She has been up and about interacting with peers and staff. Self inventory: depression and hopeless at 5. Withdrawal of slight tremors and chilling. She denies SI thoughts.

## 2013-10-12 NOTE — Progress Notes (Signed)
Patient ID: [name], female   DOB: 19-Mar-1981, 33 y.o.   MRN: [id number]          [facility] MD Progress Note  10/12/2013 3:17 PM [name]  MRN:  [id number]  Subjective:   Patient states "I am depressed really bad. I don't have much support in my life. Can't afford to care for my children. I was abusing benzos and opiates to cope. My son is staying with my sister because I lost my parental rights. I was just lying in bed all day. I am motivated to get better for my children. I want to go to [facility]. I just do not have any place to stay until then. I am afraid that I will relapse."  Objective:  Patient is active on the unit and is attending the scheduled groups. [name] continues to report high levels of depression and anxiety. Rates depression at nine and anxiety level at ten. She becomes very tearful when discussing her stressors. Patient talks openly about her daily xanax/roxicodone use for the last several years. She reports that even after detox that she is experiencing chills, sweats, and body aches. Per notes from yesterday the patient was having severe emotional difficulties. Several of her medications were adjusted yesterday. Today patient is requesting further increases in her Strattera dosing but was informed that this medication had just been increased. Patient is tolerating her medication regimen with no adverse effects. She is receptive to emotional support and encouragement.   Diagnosis:   DSM5: Schizophrenia Disorders:  NA Obsessive-Compulsive Disorders:  NA Trauma-Stressor Disorders:  NA Substance/Addictive Disorders:  Polysubstance (including opioids) dependence with physiological dependence  Depressive Disorders:  Substance induced mood disorder  Total Time spent with patient: 20 minutes  Axis I: Polysubstance (including opioids) dependence with physiological dependence Axis II: Deferred Axis III:  Past Medical History  Diagnosis Date  . Pinworms   . Depression    . Pituitary tumor    Axis IV: Polysubstance dependence Axis V: 41-50 serious symptoms  ADL's:  Intact  Sleep: Fair  Appetite:  Fair  Suicidal Ideation:  Plan:  Denies Intent:  Denies Means:  denies Homicidal Ideation:  Plan:  Denies Intent:  Denies Means:  Denies  AEB (as evidenced by):  Psychiatric Specialty Exam: Physical Exam  Review of Systems  Constitutional: Positive for chills and malaise/fatigue.  HENT: Negative.   Eyes: Negative.   Cardiovascular: Negative.   Gastrointestinal: Negative.   Genitourinary: Negative.   Musculoskeletal: Positive for myalgias.  Skin: Negative.   Neurological: Positive for tremors and weakness.  Endo/Heme/Allergies: Negative.   Psychiatric/Behavioral: Positive for depression and substance abuse (Opioid dependence). Negative for suicidal ideas, hallucinations and memory loss. The patient is nervous/anxious and has insomnia.     Blood pressure 112/75, pulse 82, temperature 97.8 F (36.6 C), temperature source Oral, resp. rate 20, height 5\' 1"  (1.549 m), weight 61.689 kg (136 lb), last menstrual period 09/12/2013, SpO2 98.00%.Body mass index is 25.71 kg/(m^2).  General Appearance: Casual  Eye Contact::  Fair  Speech:  Clear and Coherent  Volume:  Normal  Mood:  Anxious and Depressed  Affect:  Flat  Thought Process:  Coherent  Orientation:  Full (Time, Place, and Person)  Thought Content:  Rumination  Suicidal Thoughts:  No  Homicidal Thoughts:  No  Memory:  Immediate;   Good Recent;   Good Remote;   Good  Judgement:  Fair  Insight:  Fair  Psychomotor Activity:  Restlessness  Concentration:  Fair  Recall:  Good  Fund of Knowledge:Fair  Language: Good  Akathisia:  No  Handed:  Right  AIMS (if indicated):     Assets:  Desire for Improvement  Sleep:  Number of Hours: 6   Musculoskeletal: Strength & Muscle Tone: within normal limits Gait & Station: normal Patient leans: N/A  Current Medications: Current  Facility-Administered Medications  Medication Dose Route Frequency Provider Last Rate Last Dose  . acetaminophen (TYLENOL) tablet 650 mg  650 mg Oral Q6H PRN Malena Peer, NP   650 mg at 10/11/13 1518  . alum & mag hydroxide-simeth (MAALOX/MYLANTA) 200-200-20 MG/5ML suspension 30 mL  30 mL Oral Q4H PRN Evanna Glenda Chroman, NP      . atomoxetine (STRATTERA) capsule 50 mg  50 mg Oral Daily Encarnacion Slates, NP   50 mg at 10/12/13 0739  . chlordiazePOXIDE (LIBRIUM) capsule 25 mg  25 mg Oral Once Encarnacion Slates, NP      . gabapentin (NEURONTIN) capsule 300 mg  300 mg Oral TID Encarnacion Slates, NP   300 mg at 10/12/13 1140  . hydrOXYzine (ATARAX/VISTARIL) tablet 25 mg  25 mg Oral Q6H PRN Malena Peer, NP   25 mg at 10/11/13 2244  . influenza vac split quadrivalent PF (FLUARIX) injection 0.5 mL  0.5 mL Intramuscular Tomorrow-1000 Nicholaus Bloom, MD      . magnesium hydroxide (MILK OF MAGNESIA) suspension 30 mL  30 mL Oral Daily PRN Evanna Glenda Chroman, NP      . methocarbamol (ROBAXIN) tablet 500 mg  500 mg Oral Q8H PRN Encarnacion Slates, NP   500 mg at 10/12/13 0742  . multivitamin with minerals tablet 1 tablet  1 tablet Oral Daily Encarnacion Slates, NP   1 tablet at 10/12/13 0739  . QUEtiapine (SEROQUEL) tablet 150 mg  150 mg Oral QHS Nicholaus Bloom, MD      . sertraline (ZOLOFT) tablet 100 mg  100 mg Oral Daily Encarnacion Slates, NP   100 mg at 10/12/13 0739  . thiamine (B-1) injection 100 mg  100 mg Intramuscular Once Encarnacion Slates, NP      . thiamine (VITAMIN B-1) tablet 100 mg  100 mg Oral Daily Encarnacion Slates, NP   100 mg at 10/12/13 7616    Lab Results: No results found for this or any previous visit (from the past 48 hour(s)).  Physical Findings: AIMS: Facial and Oral Movements Muscles of Facial Expression: None, normal Lips and Perioral Area: None, normal Jaw: None, normal Tongue: None, normal,Extremity Movements Upper (arms, wrists, hands, fingers): None, normal Lower (legs, knees, ankles,  toes): None, normal, Trunk Movements Neck, shoulders, hips: None, normal, Overall Severity Severity of abnormal movements (highest score from questions above): None, normal Incapacitation due to abnormal movements: None, normal Patient's awareness of abnormal movements (rate only patient's report): No Awareness, Dental Status Current problems with teeth and/or dentures?: No Does patient usually wear dentures?: No  CIWA:  CIWA-Ar Total: 4 COWS:  COWS Total Score: 4  Treatment Plan Summary: Daily contact with patient to assess and evaluate symptoms and progress in treatment Medication management  Plan: Review of chart, vital signs, medications, and notes. 1-Individual and group therapy 2-Medication management for depression and anxiety:  Medications reviewed with the patient, denies any adverse effects, Continue Seroquel to150 mg Q hs for mood control, Strattera dosing from 50 mg daily for ADHD,  Neurontin at 300 mg tid for anxiety, Sertraline 100 mg daily for depression. 3-Coping  skills for depression, anxiety, and substance dependency 4-Continue crisis stabilization and management 5-Address health issues--monitoring vital signs, stable 6-Treatment plan in progress to prevent relapse of depression, substance abuse, and anxiety  Medical Decision Making Problem Points:  Established problem, stable/improving (1), Review of last therapy session (1) and Review of psycho-social stressors (1) Data Points:  Review of medication regiment & side effects (2) Review of new medications or change in dosage (2)  I certify that inpatient services furnished can reasonably be expected to improve the patient's condition.   Elmarie Shiley, NP-C 10/12/2013, 3:17 PM   Patient seen, evaluated and I agree with notes by Nurse Practitioner. Corena Pilgrim, MD

## 2013-10-12 NOTE — Progress Notes (Signed)
D Pt. Denies SI and HI, no complaints of pain or physical discomfort but reports anxiety and depression due to discharge tomorrow.  A Writer  Offered support and encouragement.  Discussed coping skills with pt.  R Pt. Remains safe on the unit.  Pt. Received vistaril for relief of the anxiety.  Pt. Admits her social worker is helping her with her discharge plans but pt. Is concerned that she has nowhere to go until April 1st when she goes to [facility].   Pt. Was seen laughing with her peers immediately after reporting that she was anxious and felt weird,  Writer was going to assess her VS and pt. Immediately changed her story to being okay just anxious, nothing physical bothering her. Pt. Went outside with  Her peers.

## 2013-10-12 NOTE — Progress Notes (Signed)
Recreation Therapy Notes  Animal-Assisted Activity/Therapy (AAA/T) Program Checklist/Progress Notes Patient Eligibility Criteria Checklist & Daily Group note for Rec Tx Intervention  Date: 03.24.2015 Time: 2:45pm Location: [address] Film/video [address]    AAA/T Program Assumption of Risk Form signed by Patient/ or Parent Legal Guardian yes  Patient is free of allergies or sever asthma yes  Patient reports no fear of animals yes  Patient reports no history of cruelty to animals yes   Patient understands his/her participation is voluntary yes  Patient washes hands before animal contact yes  Patient washes hands after animal contact yes  Behavioral Response: Engaged, Appropriate   Education: Hand Washing, Appropriate Animal Interaction   Education Outcome: Acknowledges understanding  Clinical Observations/Feedback: Patient actively engaged in session, petting therapy dog appropriately and observing peer interaction with therapy dog.   Laureen Ochs Egan Sahlin, LRT/CTRS  Lane Hacker 10/12/2013 4:48 PM

## 2013-10-12 NOTE — Progress Notes (Signed)
D   Pt is cooperative and brightens on approach  Her interactions are minimal   She reports feeling more anxious then she felt when she came in  She attends and is minimally active in groups A   Verbal support given   Medications administered and effectiveness monitored   Q 15 min checks R   Pt safe at present

## 2013-10-12 NOTE — BHH Group Notes (Signed)
Type of Therapy: Paint Therapy (with [employer] nursing students) Participation Level: Did Not Attend

## 2013-10-12 NOTE — BHH Group Notes (Signed)
[facility] LCSW Group Therapy  10/12/2013 3:12 PM  Type of Therapy:  Group Therapy  Participation Level:  Active  Participation Quality:  Attentive  Affect:  Depressed and Tearful  Cognitive:  Alert and Oriented  Insight:  Improving  Engagement in Therapy:  Improving  Modes of Intervention:  Discussion, Education, Exploration, Problem-solving, Rapport Building, Socialization and Support  Summary of Progress/Problems: MHA Speaker came to talk about his personal journey with substance abuse and addiction. The pt processed ways by which to relate to the speaker. [place] speaker provided handouts and educational information pertaining to groups and services offered by the [facility]. [name] was attentive and engaged throughout today's group and actively listened as speaker shared his personal story and reviewed various services offered by [facility].   Smart, Tryton Bodi LCSWA  10/12/2013, 3:12 PM

## 2013-10-12 NOTE — Clinical Social Work Note (Signed)
[facility] reviewing pt referral currently. Pt has phone interview with Brayton Layman at [facility] [place] this morning. TB test ordered per Aggie.   [place], LCSWA  10/12/2013 10:59 AM

## 2013-10-13 DIAGNOSIS — F1920 Other psychoactive substance dependence, uncomplicated: Secondary | ICD-10-CM

## 2013-10-13 MED ORDER — QUETIAPINE FUMARATE 200 MG PO TABS
200.0000 mg | ORAL_TABLET | Freq: Every day | ORAL | Status: DC
  Administered 2013-10-13: 200 mg via ORAL
  Filled 2013-10-13: qty 14
  Filled 2013-10-13 (×2): qty 1

## 2013-10-13 NOTE — Tx Team (Signed)
Interdisciplinary Treatment Plan Update (Adult)  Date: 10/13/2013   Time Reviewed: 11:09 AM  Progress in Treatment:  Attending groups: Yes  Participating in groups:  Yes   Taking medication as prescribed: Yes  Tolerating medication: Yes  Family/Significant othe contact made: SPE completed with pt's sister.  Patient understands diagnosis: Yes, AEB seeking treatment for cocaine abuse, opiate/benzo detox, mood stabilization, and passive SI.  Discussing patient identified problems/goals with staff: Yes  Medical problems stabilized or resolved: Yes  Denies suicidal/homicidal ideation: Yes during self report.  Patient has not harmed self or Others: Yes  New problem(s) identified:  Discharge Plan or Barriers: Pt working on admission to [place] for sometime in April. [facility] referral made few days ago-waiting for bed availability. Pt may be able to stay with her sister or grandmother until admission into treatment facility.   Additional comments: n/a  Reason for Continuation of Hospitalization: Mood stabilization Medication management  Estimated length of stay: 1 day- scheduled d/c for Thursday.  For review of initial/current patient goals, please see plan of care.  Attendees:  Patient:    Family:    Physician:    Nursing: Adonis Huguenin RN 10/13/2013 11:09 AM   Clinical Social Worker [place], Seymour  10/13/2013 11:09 AM   Other: Rise Paganini RN 10/13/2013 11:09 AM   Other: Hardie Pulley. PA 10/13/2013 11:09 AM   Other: Gerline Legacy Nurse CM  10/13/2013 11:09 AM   Other:    Scribe for Treatment Team:  [place] 10/13/2013 11:09 AM

## 2013-10-13 NOTE — Progress Notes (Signed)
Patient ID: [name], female   DOB: 1980/12/09, 33 y.o.   MRN: [id number] Patient ID: [name], female   DOB: October 28, 1980, 33 y.o.   MRN: [id number]          [facility] MD Progress Note  10/13/2013 5:24 PM [name]  MRN:  [id number]  Subjective:  [name] states "I'm still not sleeping at night. My mind keep racing, thinking about stuff, my life and my children. My mood is down. I can't seem to feel better".  Objective:  Patient is active on the unit and is attending the scheduled groups. Troy continues to report high levels of depression and anxiety. Rates depression at nine and anxiety level at ten. She becomes very tearful when discussing her stressors. Patient talks openly about her daily xanax/roxicodone use for the last several years. She reports that even after detox that she is experiencing chills, sweats, and body aches. Per notes from yesterday the patient was having severe emotional difficulties. Several of her medications were adjusted yesterday. Today patient is requesting further increases in her Strattera dosing but was informed that this medication had just been increased. Patient is tolerating her medication regimen with no adverse effects. She is receptive to emotional support and encouragement.   Diagnosis:   DSM5: Schizophrenia Disorders:  NA Obsessive-Compulsive Disorders:  NA Trauma-Stressor Disorders:  NA Substance/Addictive Disorders:  Polysubstance (including opioids) dependence with physiological dependence  Depressive Disorders:  Substance induced mood disorder  Total Time spent with patient: 20 minutes  Axis I: Polysubstance (including opioids) dependence with physiological dependence Axis II: Deferred Axis III:  Past Medical History  Diagnosis Date  . Pinworms   . Depression   . Pituitary tumor    Axis IV: Polysubstance dependence Axis V: 41-50 serious symptoms  ADL's:  Intact  Sleep: Fair  Appetite:  Fair  Suicidal Ideation:  Plan:   Denies Intent:  Denies Means:  denies Homicidal Ideation:  Plan:  Denies Intent:  Denies Means:  Denies  AEB (as evidenced by):  Psychiatric Specialty Exam: Physical Exam  Review of Systems  Constitutional: Positive for chills and malaise/fatigue.  HENT: Negative.   Eyes: Negative.   Cardiovascular: Negative.   Gastrointestinal: Negative.   Genitourinary: Negative.   Musculoskeletal: Positive for myalgias.  Skin: Negative.   Neurological: Positive for tremors and weakness.  Endo/Heme/Allergies: Negative.   Psychiatric/Behavioral: Positive for depression and substance abuse (Opioid dependence). Negative for suicidal ideas, hallucinations and memory loss. The patient is nervous/anxious and has insomnia.     Blood pressure 114/75, pulse 87, temperature 97.9 F (36.6 C), temperature source Oral, resp. rate 16, height 5\' 1"  (1.549 m), weight 61.689 kg (136 lb), last menstrual period 09/12/2013, SpO2 98.00%.Body mass index is 25.71 kg/(m^2).  General Appearance: Casual  Eye Contact::  Fair  Speech:  Clear and Coherent  Volume:  Normal  Mood:  Anxious and Depressed  Affect:  Flat  Thought Process:  Coherent  Orientation:  Full (Time, Place, and Person)  Thought Content:  Rumination  Suicidal Thoughts:  No  Homicidal Thoughts:  No  Memory:  Immediate;   Good Recent;   Good Remote;   Good  Judgement:  Fair  Insight:  Fair  Psychomotor Activity:  Restlessness  Concentration:  Fair  Recall:  Good  Fund of Knowledge:Fair  Language: Good  Akathisia:  No  Handed:  Right  AIMS (if indicated):     Assets:  Desire for Improvement  Sleep:  Number of Hours: 6  Musculoskeletal: Strength & Muscle Tone: within normal limits Gait & Station: normal Patient leans: N/A  Current Medications: Current Facility-Administered Medications  Medication Dose Route Frequency Provider Last Rate Last Dose  . acetaminophen (TYLENOL) tablet 650 mg  650 mg Oral Q6H PRN Malena Peer, NP    650 mg at 10/13/13 1654  . alum & mag hydroxide-simeth (MAALOX/MYLANTA) 200-200-20 MG/5ML suspension 30 mL  30 mL Oral Q4H PRN Evanna Glenda Chroman, NP      . atomoxetine (STRATTERA) capsule 50 mg  50 mg Oral Daily Encarnacion Slates, NP   50 mg at 10/13/13 0841  . gabapentin (NEURONTIN) capsule 300 mg  300 mg Oral TID Encarnacion Slates, NP   300 mg at 10/13/13 1624  . hydrOXYzine (ATARAX/VISTARIL) tablet 25 mg  25 mg Oral Q6H PRN Malena Peer, NP   25 mg at 10/13/13 1457  . influenza vac split quadrivalent PF (FLUARIX) injection 0.5 mL  0.5 mL Intramuscular Tomorrow-1000 Nicholaus Bloom, MD      . magnesium hydroxide (MILK OF MAGNESIA) suspension 30 mL  30 mL Oral Daily PRN Evanna Glenda Chroman, NP      . methocarbamol (ROBAXIN) tablet 500 mg  500 mg Oral Q8H PRN Encarnacion Slates, NP   500 mg at 10/13/13 1624  . multivitamin with minerals tablet 1 tablet  1 tablet Oral Daily Encarnacion Slates, NP   1 tablet at 10/13/13 [phone]  . QUEtiapine (SEROQUEL) tablet 200 mg  200 mg Oral QHS Nicholaus Bloom, MD      . sertraline (ZOLOFT) tablet 100 mg  100 mg Oral Daily Encarnacion Slates, NP   100 mg at 10/13/13 3710  . thiamine (B-1) injection 100 mg  100 mg Intramuscular Once Encarnacion Slates, NP      . thiamine (VITAMIN B-1) tablet 100 mg  100 mg Oral Daily Encarnacion Slates, NP   100 mg at 10/13/13 0843  . tuberculin injection 5 Units  5 Units Intradermal Once Encarnacion Slates, NP   5 Units at 10/12/13 1714    Lab Results: No results found for this or any previous visit (from the past 48 hour(s)).  Physical Findings: AIMS: Facial and Oral Movements Muscles of Facial Expression: None, normal Lips and Perioral Area: None, normal Jaw: None, normal Tongue: None, normal,Extremity Movements Upper (arms, wrists, hands, fingers): None, normal Lower (legs, knees, ankles, toes): None, normal, Trunk Movements Neck, shoulders, hips: None, normal, Overall Severity Severity of abnormal movements (highest score from questions above): None,  normal Incapacitation due to abnormal movements: None, normal Patient's awareness of abnormal movements (rate only patient's report): No Awareness, Dental Status Current problems with teeth and/or dentures?: No Does patient usually wear dentures?: No  CIWA:  CIWA-Ar Total: 2 COWS:  COWS Total Score: 3  Treatment Plan Summary: Daily contact with patient to assess and evaluate symptoms and progress in treatment Medication management  Plan: Review of chart, vital signs, medications, and notes. 1-Individual and group therapy 2-Medication management for depression and anxiety:  Medications reviewed with the patient, denies any adverse effects, Increased Seroquel to200 mg Q hs for mood control, Strattera dosing from 50 mg daily for ADHD,  Increased Neurontin to 400 mg tid for substance withdrawal syndrome, Sertraline 100 mg daily for depression. 3-Coping skills for depression, anxiety, and substance dependency 4-Continue crisis stabilization and management 5-Address health issues--monitoring vital signs, stable 6-Treatment plan in progress to prevent relapse of depression, substance abuse, and anxiety  Medical  Decision Making Problem Points:  Established problem, stable/improving (1), Review of last therapy session (1) and Review of psycho-social stressors (1) Data Points:  Review of medication regiment & side effects (2) Review of new medications or change in dosage (2)  I certify that inpatient services furnished can reasonably be expected to improve the patient's condition.   Lindell Spar I, NP-C 10/13/2013, 5:24 PM   Patient seen, evaluated and I agree with notes by Nurse Practitioner. Corena Pilgrim, MD

## 2013-10-13 NOTE — Clinical Social Work Note (Signed)
Per pt request, CSW called pt's grandmother. Line cut off after three rings. CSW spoke with Melissa at [facility]. Pt is 10th on waiting list. Pt likely to d/c to [facility] tomorrow afternoon if family members cannot provide space for her to stay until admission into [facility] or [facility]. Pt made aware of this and verbalized understanding. CSW encouraged pt to reach out to other family/friends this evening.  [place], [place] 10/13/2013 3:35 PM

## 2013-10-13 NOTE — BHH Group Notes (Signed)
[facility] LCSW Group Therapy  10/13/2013 2:32 PM  Type of Therapy:  Group Therapy  Participation Level:  Active  Participation Quality:  Attentive  Affect:  Depressed and Flat  Cognitive:  Oriented  Insight:  Improving  Engagement in Therapy:  Improving  Modes of Intervention:  Confrontation, Discussion, Education, Exploration, Problem-solving, Rapport Building, Socialization and Support  Summary of Progress/Problems: Emotion Regulation: This group focused on both positive and negative emotion identification and allowed group members to process ways to identify feelings, regulate negative emotions, and find healthy ways to manage internal/external emotions. Group members were asked to reflect on a time when their reaction to an emotion led to a negative outcome and explored how alternative responses using emotion regulation would have benefited them. Group members were also asked to discuss a time when emotion regulation was utilized when a negative emotion was experienced. [name] was attentive and engaged throughout today's therapy group. She shared that she struggles with regulating feelings of depression and hopelessness. She shared that medications have never really worked for her and she generally copes with alcohol. [name] shared that she plans to get counseling, go inpatient to [facility], and was able to identify her sister as a social support that she can count on. [name] shows some progress in the group setting and improving insight AEB her ability to identify healthy  alternatives to substance use.    Smart, [name] LCSWA  10/13/2013, 2:32 PM

## 2013-10-13 NOTE — Progress Notes (Signed)
D:  Patient's self inventory sheet, patient wakes up with racing thoughts, has poor sleep, improving appetite, low energy level, poor attention span.  Rated depression, hopeless, anxiety 10.  Still experiencing tremors, chilling, stomach, leg cramps.  Denied SI.  Worst pain #8 in 24 hours.  Zero pain goal.  After discharge, stay clean, eat better, take care of family, decrease depression.  Not ready to be discharged home, feels bad, depressed.  No problems taking meds after discharge. A:  Medications administered per MD orders.  Emotional support and encouragement given patient. R:  Denied SI and HI.  Denied A/V hallucinations.   Will continue to monitor patient for safety with 15 minute checks.  Safety maintained.

## 2013-10-13 NOTE — BHH Group Notes (Signed)
[facility] LCSW Aftercare Discharge Planning Group Note   10/13/2013 9:47 AM  Participation Quality:  Minimal   Mood/Affect:  Depressed and Tearful  Depression Rating:  10  Anxiety Rating:  10  Thoughts of Suicide:  No Will you contract for safety?   NA  Current AVH:  No  Plan for Discharge/Comments:  Pt reports that she is experiencing withdrawals today and is reporting anxiety and depression as high. Pt also reports racing thoughts. [facility] referral sent-waiting for bed date. [facility] is reviewing pt as well-TB test given. Pt must have this read tomorrow. Pt tearful throughout group.   Transportation Means: unknown at this time.   Supports: sister  Smart, Alicia Amel

## 2013-10-13 NOTE — Progress Notes (Signed)
Pt resting in bed, eyes closed, breathing even and unlabored. Pt remains safe on the unit. Q15 min safety checks maintained. 

## 2013-10-13 NOTE — Progress Notes (Signed)
D Pt.  Denies SI and HI, does have some complaints of  Cramps and anxiety  A Writer offered support and encouragement, received vistaril and robaxin.  R Pt. Remains safe on the unit.

## 2013-10-14 MED ORDER — QUETIAPINE FUMARATE 200 MG PO TABS: 200.0000 mg | ORAL_TABLET | Freq: Every day | ORAL | Status: DC

## 2013-10-14 MED ORDER — ATOMOXETINE HCL 25 MG PO CAPS: 50.0000 mg | ORAL_CAPSULE | Freq: Every day | ORAL | Status: DC

## 2013-10-14 MED ORDER — SERTRALINE HCL 100 MG PO TABS: 100.0000 mg | ORAL_TABLET | Freq: Every day | ORAL | Status: AC

## 2013-10-14 MED ORDER — GABAPENTIN 300 MG PO CAPS: 300.0000 mg | ORAL_CAPSULE | Freq: Three times a day (TID) | ORAL | Status: DC

## 2013-10-14 NOTE — Discharge Summary (Signed)
Physician Discharge Summary Note  Patient:  [name] is an 33 y.o., female MRN:  [id number] DOB:  Jul 01, 1981 Patient phone:  [phone] (home)  Patient address:   [place] [address],  Total Time spent with patient:   Date of Admission:  10/05/2013 Date of Discharge: 10/14/2013  Reason for Admission:  Opiate detox with suicidal ideation  Discharge Diagnoses: Active Problems:   Polysubstance (including opioids) dependence with physiological dependence   Substance induced mood disorder Assessment:  DSM5:  Schizophrenia Disorders: NA  Obsessive-Compulsive Disorders: NA  Trauma-Stressor Disorders: NA  Substance/Addictive Disorders: Opioid Disorder - Severe (304.00)  Depressive Disorders: Major Depressive Disorder - Severe (296.23)  AXIS I: Opioid Disorder - Severe (304.00), Major depressive disorder, severe  AXIS II: Deferred  AXIS III:  Past Medical History   Diagnosis  Date   .  Pinworms    .  Depression    .  Pituitary tumor     AXIS IV: Polysubstance dependence  AXIS V: 1-10 persistent dangerousness to self and others present      Psychiatric Specialty Exam: Physical Exam  Constitutional: She appears well-developed and well-nourished.    ROS  Blood pressure 107/73, pulse 98, temperature 97.6 F (36.4 C), temperature source Oral, resp. rate 16, height 5\' 1"  (1.549 m), weight 136 lb (61.689 kg), last menstrual period 09/12/2013, SpO2 98.00%.Body mass index is 25.71 kg/(m^2).  General Appearance: Casual  Eye Contact::  Fair  Speech:  Clear and Coherent  Volume:  Normal  Mood:  Euthymic  Affect:  Congruent  Thought Process:  Goal Directed  Orientation:  Full (Time, Place, and Person)  Thought Content:  WDL  Suicidal Thoughts:  No  Homicidal Thoughts:  No  Memory:  NA  Judgement:  Intact  Insight:  Present  Psychomotor Activity:  Normal  Concentration:  Fair  Recall:  [place] of Knowledge:Good  Language: Good  Akathisia:   No  Handed:  Right  AIMS (if indicated):     Assets:  Communication Skills Desire for Improvement  Sleep:  Number of Hours: 6.25   Musculoskeletal: Strength & Muscle Tone: within normal limits Gait & Station: normal Patient leans: N/A  DSM5: Axis Diagnosis:  Assessment:  DSM5:  Schizophrenia Disorders: NA  Obsessive-Compulsive Disorders: NA  Trauma-Stressor Disorders: NA  Substance/Addictive Disorders: Opioid Disorder - Severe (304.00)  Depressive Disorders: Major Depressive Disorder - Severe (296.23)  AXIS I: Opioid Disorder - Severe (304.00), Major depressive disorder, severe  AXIS II: Deferred  AXIS III:  Past Medical History   Diagnosis  Date   .  Pinworms    .  Depression    .  Pituitary tumor     AXIS IV: Polysubstance dependence  AXIS V: 1-10 persistent dangerousness to self and others present   Level of Care:  OP  Hospital Course:             [name] is a 33 year old WF who was admitted for opiate detox and a history of OD attempt. She was evaluated and felt to be in need of admission for stabilization and crisis management.           She was admitted from [facility] ED for polysubstance abuse and psychiatric treatment for substance induced mood disorder.  She was started on an opioid withdrawal protocol with clonidine. Her  Seroquel was increased to 200mg  at bedtime to help with sleep.  [name] was encouraged to participate in unit programming especially AA/NA meetings.  [name] had some participation in group, but missed several as well. She was often tearful and dramatic in groups, and refused to attend others. She reported multiple somatic complaints that were incongruent with her observed behavior. She was on the standard clonidine withdrawal protocol and monitored daily with COWS scores. Her COWS was a 4 on the day of admission and a 3 on the day of discharge 9 days later.         [name] was placed on the [facility] wait list and encouraged to follow up there  as scheduled for bed availability. She was discharged home in improved condition. Consults:  None  Significant Diagnostic Studies:  None  Discharge Vitals:   Blood pressure 107/73, pulse 98, temperature 97.6 F (36.4 C), temperature source Oral, resp. rate 16, height 5\' 1"  (1.549 m), weight 136 lb (61.689 kg), last menstrual period 09/12/2013, SpO2 98.00%. Body mass index is 25.71 kg/(m^2). Lab Results:   No results found for this or any previous visit (from the past 72 hour(s)).  Physical Findings: AIMS: Facial and Oral Movements Muscles of Facial Expression: None, normal Lips and Perioral Area: None, normal Jaw: None, normal Tongue: None, normal,Extremity Movements Upper (arms, wrists, hands, fingers): None, normal Lower (legs, knees, ankles, toes): None, normal, Trunk Movements Neck, shoulders, hips: None, normal, Overall Severity Severity of abnormal movements (highest score from questions above): None, normal Incapacitation due to abnormal movements: None, normal Patient's awareness of abnormal movements (rate only patient's report): No Awareness, Dental Status Current problems with teeth and/or dentures?: No Does patient usually wear dentures?: No  CIWA:  CIWA-Ar Total: 2 COWS:  COWS Total Score: 3  Psychiatric Specialty Exam: See Psychiatric Specialty Exam and Suicide Risk Assessment completed by Attending Physician prior to discharge.  Discharge destination:  Home  Is patient on multiple antipsychotic therapies at discharge:  No   Has Patient had three or more failed trials of antipsychotic monotherapy by history:  No  Recommended Plan for Multiple Antipsychotic Therapies: NA  Discharge Orders   Future Orders Complete By Expires   Diet - low sodium heart healthy  As directed    Discharge instructions  As directed    Comments:     Take all of your medications as directed. Be sure to keep all of your follow up appointments.  If you are unable to keep your follow up  appointment, call your Doctor's office to let them know, and reschedule.  Make sure that you have enough medication to last until your appointment. Be sure to get plenty of rest. Going to bed at the same time each night will help. Try to avoid sleeping during the day.  Increase your activity as tolerated. Regular exercise will help you to sleep better and improve your mental health. Eating a heart healthy diet is recommended. Try to avoid salty or fried foods. Be sure to avoid all alcohol and illegal drugs.   Increase activity slowly  As directed        Medication List    STOP taking these medications       ALPRAZolam 1 MG tablet  Commonly known as:  XANAX      TAKE these medications     Indication   atomoxetine 25 MG capsule  Commonly known as:  STRATTERA  Take 2 capsules (50 mg total) by mouth daily. For ADD   Indication:  Attention Deficit Hyperactivity Disorder     gabapentin 300 MG capsule  Commonly known as:  NEURONTIN  Take 1  capsule (300 mg total) by mouth 3 (three) times daily. For mood stabilization and withdrawal symptoms.   Indication:  Agitation     QUEtiapine 200 MG tablet  Commonly known as:  SEROQUEL  Take 1 tablet (200 mg total) by mouth at bedtime.   Indication:  Depressive Phase of Manic-Depression     sertraline 100 MG tablet  Commonly known as:  ZOLOFT  Take 1 tablet (100 mg total) by mouth daily. For major depressive disorder and anxiety.   Indication:  Major Depressive Disorder           Follow-up Information   Follow up with [facility]. (Referral submitted. Please continue to follow up with intake coordinator [facility]) at this facility regarding admission. )    Contact information:   [address]  [place], [place] [address] Phone: [phone] Fax: [phone]      Follow up with [facility]. (You are currently on waiting list. Contact person is [phone]. Call [facility] daily at 9:15AM to check for female bed availability.)    Contact  information:   [address] [place], [place] [address] Phone: [phone] Fax: [phone]      Follow-up recommendations:   Activities: Resume activity as tolerated. Diet: Heart healthy low sodium diet Tests: Follow up testing will be determined by your out patient provider. Comments:    Total Discharge Time:  Greater than 30 minutes.  Signed: MASHBURN,NEIL 10/14/2013, 10:34 AM  Patient was seen face to face for psych evaluation, suicide risk assessment and case discussed with treatment team and physician extender and made disposition plan. Reviewed the information documented and agree with the treatment plan.  Nilza Eaker,JANARDHAHA R. 10/18/2013 2:51 PM

## 2013-10-14 NOTE — BHH Group Notes (Signed)
[facility] LCSW Group Therapy  10/14/2013 2:53 PM  Type of Therapy:  Group Therapy  Participation Level: Minimal   Participation Quality:  Attentive  Affect:  Flat  Cognitive:  Oriented  Insight:  Limited  Engagement in Therapy:  Limited  Modes of Intervention:  Discussion, Education, Exploration, Limit-setting, Problem-solving, Rapport Building, Socialization and Support  Summary of Progress/Problems: Group discussed balance in terms of different personality types such as passive, aggressive, and assertive. [name] actively listented as others shared in group but did not participate in group discussion unless called upon. She shared that she identifies with the passive personality but hopes to be more assertive in the future. [name] remained attentive throughout group but did not contribute any more to the discussion. At this time, [name] demonstrates limited progress in the group setting.    Smart, Zoriana Oats LCSWA  10/14/2013, 2:53 PM

## 2013-10-14 NOTE — Clinical Social Work Note (Addendum)
Pt provided with bus pass, [employer], shelter info, [place] list, and other resources for [employer]. CSW contacted [employer], [employer] (spoke with [facility]-no female beds), [employer], and [place] beds at any locations available. CSW left message with pt's sister asking for assistance on getting pt safe place to stay until her [facility] admission. Aggie notified of this issue.  [place], LCSWA  10/14/2013 12:44 PM    CSW and pt met individually. Pt's grandmother agreeable to allowing pt to live with her until admission into [facility]. Pt plans to continue with pursuing [facility] house admission as well. CSW waiting for doc signiture and TB results before sending her paperwork. Pt aware of this and aware of instructions to continue with contacting Melissa at [facility]ica at [facility] after d/c.  [place], [place] 10/14/2013 2:49 PM

## 2013-10-14 NOTE — BHH Suicide Risk Assessment (Signed)
   Demographic Factors:  Adolescent or young adult, Caucasian, Low socioeconomic status, Living alone and Unemployed  Total Time spent with patient: 30 minutes  Psychiatric Specialty Exam: Physical Exam  ROS  Blood pressure 107/73, pulse 98, temperature 97.6 F (36.4 C), temperature source Oral, resp. rate 16, height 5\' 1"  (1.549 m), weight 61.689 kg (136 lb), last menstrual period 09/12/2013, SpO2 98.00%.Body mass index is 25.71 kg/(m^2).  General Appearance: Casual  Eye Contact::  Good  Speech:  Clear and Coherent  Volume:  Normal  Mood:  Depressed  Affect:  Appropriate  Thought Process:  Coherent and Goal Directed  Orientation:  Full (Time, Place, and Person)  Thought Content:  WDL  Suicidal Thoughts:  No  Homicidal Thoughts:  No  Memory:  Immediate;   Fair  Judgement:  Fair  Insight:  Fair  Psychomotor Activity:  Normal  Concentration:  Good  Recall:  Good  Fund of Knowledge:Good  Language: NA  Akathisia:  NA  Handed:  Right  AIMS (if indicated):     Assets:  Communication Skills Desire for Improvement Leisure Time Physical Health Resilience Social Support Talents/Skills  Sleep:  Number of Hours: 6.25    Musculoskeletal: Strength & Muscle Tone: within normal limits Gait & Station: normal Patient leans: N/A   Mental Status Per Nursing Assessment::   On Admission:  Self-harm thoughts (denies upon [employer])  Current Mental Status by Physician: NA  Loss Factors: Decrease in vocational status and Financial problems/change in socioeconomic status  Historical Factors: Family history of mental illness or substance abuse  Risk Reduction Factors:   Sense of responsibility to family, Religious beliefs about death, Positive social support, Positive therapeutic relationship and Positive coping skills or problem solving skills  Continued Clinical Symptoms:  Depression:   Recent sense of peace/wellbeing Alcohol/Substance Abuse/Dependencies Unstable or  Poor Therapeutic Relationship Previous Psychiatric Diagnoses and Treatments  Cognitive Features That Contribute To Risk:  Polarized thinking    Suicide Risk:  Minimal: No identifiable suicidal ideation.  Patients presenting with no risk factors but with morbid ruminations; may be classified as minimal risk based on the severity of the depressive symptoms  Discharge Diagnoses:   AXIS I:  Substance Induced Mood Disorder and polysubstance dependence AXIS II:  Deferred AXIS III:   Past Medical History  Diagnosis Date  . Pinworms   . Depression   . Pituitary tumor    AXIS IV:  economic problems, housing problems, other psychosocial or environmental problems, problems related to social environment and problems with primary support group AXIS V:  61-70 mild symptoms  Plan Of Care/Follow-up recommendations:  Activity:  As tolerated Diet:  Regular  Is patient on multiple antipsychotic therapies at discharge:  No   Has Patient had three or more failed trials of antipsychotic monotherapy by history:  No  Recommended Plan for Multiple Antipsychotic Therapies: NA    Maizee Reinhold,JANARDHAHA R. 10/14/2013, 1:04 PM

## 2013-10-14 NOTE — Progress Notes (Signed)
Patient ID: [name], female   DOB: 21-Mar-1981, 33 y.o.   MRN: [id number]  Patient currently asleep; no s/s of distress noted at this time. Respirations regular and unlabored.

## 2013-10-14 NOTE — Progress Notes (Signed)
Recreation Therapy Notes  Animal-Assisted Activity/Therapy (AAA/T) Program Checklist/Progress Notes Patient Eligibility Criteria Checklist & Daily Group note for Rec Tx Intervention  Date: 03.26.2015 Time: 2:45pm Location: [address] Dayroom    AAA/T Program Assumption of Risk Form signed by Patient/ or Parent Legal Guardian yes  Patient is free of allergies or sever asthma yes  Patient reports no fear of animals yes  Patient reports no history of cruelty to animals yes   Patient understands his/her participation is voluntary yes  Behavioral Response: DID NOT ATTEND.   Walt Geathers L Gibbs Naugle, LRT/CTRS  Jenasis Straley L 10/14/2013 4:39 PM 

## 2013-10-14 NOTE — Progress Notes (Signed)
Pt. Discharged per MD orders;  PT. Currently denies any HI/SI or AVH.  Pt. Was given education regarding follow up appointments and medications by RN.  Pt. Denies any questions or concerns about the medications.  Pt. Was escorted to the search room to retrieve her belongings by RN before being discharged to the hospital lobby.  

## 2013-10-14 NOTE — Progress Notes (Addendum)
[facility] Adult Case Management Discharge Plan :  Will you be returning to the same living situation after discharge: No.[facility] until admission to [facility]. At discharge, do you have transportation home?:Yes,  bus pass Do you have the ability to pay for your medications:Yes,  medicaid  Release of information consent forms completed and submitted to Medical Records by CSW.  Patient to Follow up at: Follow-up Information   Follow up with [facility]. (Referral submitted. Please continue to follow up with intake coordinator [facility]) at this facility regarding admission. )    Contact information:   [address]  [place], [place] [address] Phone: [phone] Fax: [phone]      Follow up with [facility]. (You are currently on waiting list. Contact person is [phone]. Call [facility] daily at 9:15AM to check for female bed availability.)    Contact information:   [address] [place], [place] [address] Phone: [phone] Fax: [phone]      Follow up with [facility]. (Walk in between 8am-9am Monday through Friday for hospital follow-up/medication management/assessment for therapy services. )    Contact information:   [address] [place], [place] [address] Phone: [phone] Fax: [phone]      Patient denies SI/HI:   Yes,  during self report/group    Safety Planning and Suicide Prevention discussed:  Yes,  SPE completed with pt's sister. SPI pamphlet provided to pt and she was encouraged to share information with support network, ask questions, and talk about any concerns relating to SPE.  Smart, Medina Degraffenreid LCSWA  10/14/2013, 11:53 AM

## 2013-10-19 NOTE — Clinical Social Work Note (Signed)
[name] ([name]) contacted CSW. They needed updated med list-CSW provided this info and sent fax to [phone]. Pt had signed consent allowing this transmission of information while inpatient at [facility]. She is to be accepted today at [facility] after they receive said information per [facility] (intake coordinator).   [place], LCSWA 10/19/2013 9:06 AM

## 2013-10-19 NOTE — Progress Notes (Signed)
Patient Discharge Instructions:  After Visit Summary (AVS):   Faxed to:  10/19/13 Discharge Summary Note:   Faxed to:  10/19/13 Psychiatric Admission Assessment Note:   Faxed to:  10/19/13 Suicide Risk Assessment - Discharge Assessment:   Faxed to:  10/19/13 Faxed/Sent to the Next Level Care provider:  10/19/13 Faxed to [facility] @ [phone] Faxed to [facility] @ [phone] Faxed to [facility] @ [place], 10/19/2013, 12:59 PM

## 2013-12-04 ENCOUNTER — Emergency Department (HOSPITAL_COMMUNITY)
Admission: EM | Admit: 2013-12-04 | Discharge: 2013-12-04 | Disposition: A | Discharge status: Home / Self Care | Payer: Medicaid Other | Attending: Emergency Medicine | Admitting: Emergency Medicine

## 2013-12-04 ENCOUNTER — Encounter (HOSPITAL_COMMUNITY): Payer: Self-pay | Admitting: Emergency Medicine

## 2013-12-04 DIAGNOSIS — B9689 Other specified bacterial agents as the cause of diseases classified elsewhere: Secondary | ICD-10-CM | POA: Insufficient documentation

## 2013-12-04 DIAGNOSIS — Z8639 Personal history of other endocrine, nutritional and metabolic disease: Secondary | ICD-10-CM | POA: Insufficient documentation

## 2013-12-04 DIAGNOSIS — N760 Acute vaginitis: Secondary | ICD-10-CM | POA: Insufficient documentation

## 2013-12-04 DIAGNOSIS — F329 Major depressive disorder, single episode, unspecified: Secondary | ICD-10-CM | POA: Insufficient documentation

## 2013-12-04 DIAGNOSIS — Z8719 Personal history of other diseases of the digestive system: Secondary | ICD-10-CM | POA: Insufficient documentation

## 2013-12-04 DIAGNOSIS — Z9851 Tubal ligation status: Secondary | ICD-10-CM | POA: Insufficient documentation

## 2013-12-04 DIAGNOSIS — F3289 Other specified depressive episodes: Secondary | ICD-10-CM | POA: Insufficient documentation

## 2013-12-04 DIAGNOSIS — N940 Mittelschmerz: Secondary | ICD-10-CM | POA: Insufficient documentation

## 2013-12-04 DIAGNOSIS — Z79899 Other long term (current) drug therapy: Secondary | ICD-10-CM | POA: Insufficient documentation

## 2013-12-04 DIAGNOSIS — Z880 Allergy status to penicillin: Secondary | ICD-10-CM | POA: Insufficient documentation

## 2013-12-04 DIAGNOSIS — A499 Bacterial infection, unspecified: Secondary | ICD-10-CM | POA: Insufficient documentation

## 2013-12-04 DIAGNOSIS — Z862 Personal history of diseases of the blood and blood-forming organs and certain disorders involving the immune mechanism: Secondary | ICD-10-CM | POA: Insufficient documentation

## 2013-12-04 LAB — URINALYSIS, ROUTINE W REFLEX MICROSCOPIC
BILIRUBIN URINE: NEGATIVE
Glucose, UA: NEGATIVE mg/dL
Hgb urine dipstick: NEGATIVE
KETONES UR: NEGATIVE mg/dL
Leukocytes, UA: NEGATIVE
NITRITE: NEGATIVE
PROTEIN: NEGATIVE mg/dL
Specific Gravity, Urine: 1.025 (ref 1.005–1.030)
UROBILINOGEN UA: 0.2 mg/dL (ref 0.0–1.0)
pH: 6 (ref 5.0–8.0)

## 2013-12-04 LAB — WET PREP, GENITAL
Trich, Wet Prep: NONE SEEN
Yeast Wet Prep HPF POC: NONE SEEN

## 2013-12-04 MED ORDER — NAPROXEN SODIUM 550 MG PO TABS: 550.0000 mg | ORAL_TABLET | Freq: Two times a day (BID) | ORAL | Status: DC

## 2013-12-04 MED ORDER — KETOROLAC TROMETHAMINE 60 MG/2ML IM SOLN
60.0000 mg | Freq: Once | INTRAMUSCULAR | Status: AC
  Administered 2013-12-04: 60 mg via INTRAMUSCULAR
  Filled 2013-12-04: qty 2

## 2013-12-04 MED ORDER — METRONIDAZOLE 500 MG PO TABS: 500.0000 mg | ORAL_TABLET | Freq: Two times a day (BID) | ORAL | Status: DC

## 2013-12-04 MED ORDER — HYDROCODONE-ACETAMINOPHEN 5-325 MG PO TABS: 1.0000 | ORAL_TABLET | ORAL | Status: DC | PRN

## 2013-12-04 MED ORDER — KETOROLAC TROMETHAMINE 60 MG/2ML IM SOLN: 60.0000 mg | Freq: Once | INTRAMUSCULAR | Status: DC

## 2013-12-04 MED ORDER — HYDROCODONE-ACETAMINOPHEN 5-325 MG PO TABS
1.0000 | ORAL_TABLET | Freq: Once | ORAL | Status: AC
  Administered 2013-12-04: 1 via ORAL
  Filled 2013-12-04: qty 1

## 2013-12-04 NOTE — ED Notes (Signed)
Patient c/o right lower abd pain x1 week with some diarrhea, urinary frequency, and clear vaginal discharge. Patient also reports pressure with urination. Denies any nausea, vomiting, or fevers.

## 2013-12-04 NOTE — ED Provider Notes (Signed)
CSN: [id number]     Arrival date & time 12/04/13  1053 History   First MD Initiated Contact with Patient 12/04/13 1140     Chief Complaint  Patient presents with  . Abdominal Pain     (Consider location/radiation/quality/duration/timing/severity/associated sxs/prior Treatment) Patient is a 33 y.o. female presenting with abdominal pain. The history is provided by the patient.  Abdominal Pain Pain location:  LLQ Pain quality: aching and sharp   Pain radiates to:  Does not radiate Pain severity:  Moderate Onset quality:  Gradual Duration:  1 week Timing:  Intermittent Progression:  Worsening Chronicity:  New Relieved by:  Nothing Worsened by:  Urination Ineffective treatments:  NSAIDs Associated symptoms: no anorexia, no chest pain, no constipation, no cough, no dysuria, no fever, no hematuria, no shortness of breath, no vaginal bleeding and no vomiting  Diarrhea: loose stools.   Risk factors: not pregnant    SKARLETTE LATTNER is a 33 y.o. female who presents to the ED with abdominal pain and frequent urination that started a week ago. She also complains of vaginal discharge that started 2 weeks ago. She has not been sexually active in 4 months. She was hospitalized for detox and now lives in a group home with 5 other women. Patient's last menstrual period was 11/19/2013.  Past Medical History  Diagnosis Date  . Pinworms   . Depression   . Pituitary tumor    Past Surgical History  Procedure Laterality Date  . Cesarean section      x2  . Appendectomy    . Pinched nerve rt arm.    . Ectopic pregnancy surgery    . Tubal ligation     Family History  Problem Relation Age of Onset  . Diabetes Mother    History  Substance Use Topics  . Smoking status: Never Smoker   . Smokeless tobacco: Never Used  . Alcohol Use: No   OB History   Grav Para Term Preterm Abortions TAB SAB Ect Mult Living   5 2 2  3  2 1  2      Review of Systems  Constitutional: Negative for fever.   HENT: Negative.   Eyes: Negative for visual disturbance.  Respiratory: Negative for cough and shortness of breath.   Cardiovascular: Negative for chest pain.  Gastrointestinal: Positive for abdominal pain. Negative for vomiting, constipation and anorexia. Diarrhea: loose stools.  Genitourinary: Negative for dysuria, hematuria and vaginal bleeding.  Neurological: Negative for syncope and headaches.  Psychiatric/Behavioral: Negative for confusion. The patient is not nervous/anxious.       Allergies  Amoxicillin  Home Medications   Prior to Admission medications   Medication Sig Start Date End Date Taking? Authorizing Provider  atomoxetine (STRATTERA) 25 MG capsule Take 2 capsules (50 mg total) by mouth daily. For ADD 10/14/13   Nena Polio, PA-C  gabapentin (NEURONTIN) 300 MG capsule Take 1 capsule (300 mg total) by mouth 3 (three) times daily. For mood stabilization and withdrawal symptoms. 10/14/13   Nena Polio, PA-C  QUEtiapine (SEROQUEL) 200 MG tablet Take 1 tablet (200 mg total) by mouth at bedtime. 10/14/13   Nena Polio, PA-C  sertraline (ZOLOFT) 100 MG tablet Take 1 tablet (100 mg total) by mouth daily. For major depressive disorder and anxiety. 10/14/13   Nena Polio, PA-C   BP 119/71  Pulse 73  Temp(Src) 98.1 F (36.7 C) (Oral)  Resp 16  Ht 5\' 1"  (1.549 m)  Wt 145 lb (65.772 kg)  BMI 27.41 kg/m2  SpO2 100%  LMP 11/19/2013 Physical Exam  Nursing note and vitals reviewed. Constitutional: She is oriented to person, place, and time. She appears well-developed and well-nourished.  HENT:  Head: Normocephalic.  Eyes: Conjunctivae and EOM are normal.  Neck: Neck supple.  Cardiovascular: Normal rate and regular rhythm.   Pulmonary/Chest: Effort normal and breath sounds normal.  Abdominal: Soft. Bowel sounds are normal. There is tenderness in the suprapubic area and left lower quadrant. CVA tenderness: left.  Genitourinary:  External genitalia without lesions. Mucous  discharge vaginal vault. No CMT, mild left adnexal tenderness. Uterus without palpable enlargement.   Musculoskeletal: Normal range of motion.  Neurological: She is alert and oriented to person, place, and time. No cranial nerve deficit.  Skin: Skin is warm and dry.  Psychiatric: She has a normal mood and affect. Her behavior is normal.   Results for orders placed during the hospital encounter of 12/04/13 (from the past 24 hour(s))  URINALYSIS, ROUTINE W REFLEX MICROSCOPIC     Status: Abnormal   Collection Time    12/04/13 11:45 AM      Result Value Ref Range   Color, Urine YELLOW  YELLOW   APPearance HAZY (*) CLEAR   Specific Gravity, Urine 1.025  1.005 - 1.030   pH 6.0  5.0 - 8.0   Glucose, UA NEGATIVE  NEGATIVE mg/dL   Hgb urine dipstick NEGATIVE  NEGATIVE   Bilirubin Urine NEGATIVE  NEGATIVE   Ketones, ur NEGATIVE  NEGATIVE mg/dL   Protein, ur NEGATIVE  NEGATIVE mg/dL   Urobilinogen, UA 0.2  0.0 - 1.0 mg/dL   Nitrite NEGATIVE  NEGATIVE   Leukocytes, UA NEGATIVE  NEGATIVE  WET PREP, GENITAL     Status: Abnormal   Collection Time    12/04/13  1:29 PM      Result Value Ref Range   Yeast Wet Prep HPF POC NONE SEEN  NONE SEEN   Trich, Wet Prep NONE SEEN  NONE SEEN   Clue Cells Wet Prep HPF POC MODERATE (*) NONE SEEN   WBC, Wet Prep HPF POC FEW (*) NONE SEEN    ED Course  Procedures  MDM  33 y.o. female with vaginal discharge and abdominal cramping in the left lower area. Will treat for mittleschmerz and BV. Patient given Toradol 60 mg which helped the pain some and then Hydrocodone that relieved the pain. Will treat pain. Stable for discharge without acute abdomen. She will follow up with her PCP or return here as needed for worsening symptoms. Discussed with the patient and all questioned fully answered.    Medication List    TAKE these medications       HYDROcodone-acetaminophen 5-325 MG per tablet  Commonly known as:  NORCO/VICODIN  Take 1 tablet by mouth every 4 (four)  hours as needed.     metroNIDAZOLE 500 MG tablet  Commonly known as:  FLAGYL  Take 1 tablet (500 mg total) by mouth 2 (two) times daily.     naproxen sodium 550 MG tablet  Commonly known as:  ANAPROX DS  Take 1 tablet (550 mg total) by mouth 2 (two) times daily with a meal.      ASK your doctor about these medications       gabapentin 300 MG capsule  Commonly known as:  NEURONTIN  Take 1 capsule (300 mg total) by mouth 3 (three) times daily. For mood stabilization and withdrawal symptoms.     hydrocortisone cream 1 %  Apply 1 application topically  daily as needed for itching.     ibuprofen 200 MG tablet  Commonly known as:  ADVIL,MOTRIN  Take 600 mg by mouth every 6 (six) hours as needed for moderate pain.     QUEtiapine 50 MG tablet  Commonly known as:  SEROQUEL  Take 50 mg by mouth at bedtime.     sertraline 100 MG tablet  Commonly known as:  ZOLOFT  Take 1 tablet (100 mg total) by mouth daily. For major depressive disorder and anxiety.     traZODone 150 MG tablet  Commonly known as:  DESYREL  Take 150 mg by mouth at bedtime.            Relena Mur[place], [place] 12/04/13 1650

## 2013-12-04 NOTE — Discharge Instructions (Signed)
Mittelschmerz  Mittelschmerz is lower abdominal pain that happens between menstrual periods. Mittelschmerz is a [place] word that means "middle pain." It may occur right before, during, or after ovulation. It is usually felt on either the right or left side, depending on which ovary is passing the egg.  CAUSES  Pain may be felt when:  There is irritation (inflammation) inside the abdomen. This is caused by the small amount of blood or fluid that may come from releasing the egg.  The covering of the ovary stretches.  Ovarian cysts develop.  You have endometriosis. This is when the uterine lining tissue grows outside of the uterus.  You have endometriomas. These are cysts that are formed by endometrial tissue. SYMPTOMS  Pain may be:  One-sided pain unless both ovaries are ovulating at the same time. If both ovaries are ovulating, there may be pain on both sides. This pain is often repeated every month. At times, there may be a month or two with no pain.  Dull, cramping, or sharp.  Short-lived or last up to 24 to 48 hours.  Felt with bowel movements, diarrhea, or intercourse.  Accompanied by a slight amount of vaginal bleeding. DIAGNOSIS   Your caregiver will take a history and do a physical exam.  Blood tests and abdominal ultrasounds may be performed if the problem continues, becomes worse, or does not respond to the usual treatment.  A thin, lighted tube may be put into your abdomen (laparoscopy) to check for problems if the pain gets worse or does not go away. TREATMENT  Usually, no treatment is needed. If treatment is needed, it may include:  Taking over-the-counter pain relievers.  Taking birth control pills (oral contraceptives). This may be used to stop ovulation.  Medical or surgical treatment if you have endometriomas. Together, you and your caregiver can decide which course of treatment is best for you. HOME CARE INSTRUCTIONS   Only take over-the-counter or  prescription medicines for pain, discomfort, or fever as directed by your caregiver. Do not use aspirin. Aspirin may increase bleeding.  Write down when the pain comes in relation to your menstrual period. Write down how bad it is, if you have a fever with the pain, and how long it lasts. SEEK MEDICAL CARE IF:   Your pain increases and is not controlled with medicine.  Your pain is on both sides of your abdomen.  You develop vaginal bleeding (more than just spotting) with the pain.  You have a fever.  You develop nausea or vomiting.  You feel lightheaded or faint. MAKE SURE YOU:   Understand these instructions.  Will watch your condition.  Will get help right away if you are not doing well or get worse. Document Released: 06/28/2002 Document Revised: 09/30/2011 Document Reviewed: 10/05/2010 [facility] Patient Information 2014 [place], [place].

## 2013-12-05 NOTE — ED Provider Notes (Signed)
Medical screening examination/treatment/procedure(s) were conducted as a shared visit with non-physician practitioner(s) and myself.  I personally evaluated the patient during the encounter.   EKG Interpretation None     No acute abdomen. Will treat for bacterial vaginosis   Nat Christen, MD 12/05/13 [phone]

## 2013-12-06 LAB — GC/CHLAMYDIA PROBE AMP
CT Probe RNA: NEGATIVE
GC Probe RNA: NEGATIVE

## 2014-02-20 ENCOUNTER — Emergency Department (HOSPITAL_COMMUNITY)
Admission: EM | Admit: 2014-02-20 | Discharge: 2014-02-20 | Disposition: A | Discharge status: Home / Self Care | Payer: 59 | Attending: Emergency Medicine | Admitting: Emergency Medicine

## 2014-02-20 ENCOUNTER — Encounter (HOSPITAL_COMMUNITY): Payer: Self-pay | Admitting: Emergency Medicine

## 2014-02-20 DIAGNOSIS — Z8619 Personal history of other infectious and parasitic diseases: Secondary | ICD-10-CM | POA: Insufficient documentation

## 2014-02-20 DIAGNOSIS — F329 Major depressive disorder, single episode, unspecified: Secondary | ICD-10-CM | POA: Insufficient documentation

## 2014-02-20 DIAGNOSIS — F3289 Other specified depressive episodes: Secondary | ICD-10-CM | POA: Insufficient documentation

## 2014-02-20 DIAGNOSIS — Z862 Personal history of diseases of the blood and blood-forming organs and certain disorders involving the immune mechanism: Secondary | ICD-10-CM | POA: Insufficient documentation

## 2014-02-20 DIAGNOSIS — Z880 Allergy status to penicillin: Secondary | ICD-10-CM | POA: Insufficient documentation

## 2014-02-20 DIAGNOSIS — Z8639 Personal history of other endocrine, nutritional and metabolic disease: Secondary | ICD-10-CM | POA: Insufficient documentation

## 2014-02-20 DIAGNOSIS — M545 Low back pain, unspecified: Secondary | ICD-10-CM | POA: Insufficient documentation

## 2014-02-20 DIAGNOSIS — M549 Dorsalgia, unspecified: Secondary | ICD-10-CM | POA: Insufficient documentation

## 2014-02-20 DIAGNOSIS — M5489 Other dorsalgia: Secondary | ICD-10-CM

## 2014-02-20 DIAGNOSIS — Z79899 Other long term (current) drug therapy: Secondary | ICD-10-CM | POA: Insufficient documentation

## 2014-02-20 MED ORDER — IBUPROFEN 800 MG PO TABS
800.0000 mg | ORAL_TABLET | Freq: Once | ORAL | Status: AC
  Administered 2014-02-20: 800 mg via ORAL
  Filled 2014-02-20: qty 1

## 2014-02-20 NOTE — ED Provider Notes (Signed)
CSN: [id number]     Arrival date & time 02/20/14  0033 History   First MD Initiated Contact with Patient 02/20/14 0216     Chief Complaint  Patient presents with  . Back Pain      Patient is a 33 y.o. female presenting with back pain. The history is provided by the patient.  Back Pain Location:  Lumbar spine Quality:  Aching Radiates to:  R thigh Pain severity:  Moderate Onset quality:  Gradual Duration:  3 months Timing:  Constant Progression:  Worsening Relieved by:  Nothing Worsened by:  Movement Associated symptoms: no abdominal pain, no bladder incontinence, no bowel incontinence, no dysuria, no fever and no weakness   Risk factors: no recent surgery   Risk factors comment:  Denies IVDA   Past Medical History  Diagnosis Date  . Pinworms   . Depression   . Pituitary tumor    Past Surgical History  Procedure Laterality Date  . Cesarean section      x2  . Appendectomy    . Pinched nerve rt arm.    . Ectopic pregnancy surgery    . Tubal ligation     Family History  Problem Relation Age of Onset  . Diabetes Mother    History  Substance Use Topics  . Smoking status: Never Smoker   . Smokeless tobacco: Never Used  . Alcohol Use: No   OB History   Grav Para Term Preterm Abortions TAB SAB Ect Mult Living   5 2 2  3  2 1  2      Review of Systems  Constitutional: Negative for fever.  Gastrointestinal: Negative for abdominal pain and bowel incontinence.  Genitourinary: Negative for bladder incontinence and dysuria.  Musculoskeletal: Positive for back pain.  Neurological: Negative for weakness.      Allergies  Amoxicillin  Home Medications   Prior to Admission medications   Medication Sig Start Date End Date Taking? Authorizing Provider  gabapentin (NEURONTIN) 300 MG capsule Take 1 capsule (300 mg total) by mouth 3 (three) times daily. For mood stabilization and withdrawal symptoms. 10/14/13  Yes Nena Polio, PA-C  ibuprofen (ADVIL,MOTRIN) 200 MG  tablet Take 600 mg by mouth every 6 (six) hours as needed for moderate pain.   Yes Historical Provider, MD  QUEtiapine (SEROQUEL) 50 MG tablet Take 50 mg by mouth at bedtime.   Yes Historical Provider, MD  sertraline (ZOLOFT) 100 MG tablet Take 1 tablet (100 mg total) by mouth daily. For major depressive disorder and anxiety. 10/14/13  Yes Nena Polio, PA-C  traZODone (DESYREL) 150 MG tablet Take 150 mg by mouth at bedtime.   Yes Historical Provider, MD   BP 124/75  Pulse 90  Temp(Src) 98.2 F (36.8 C) (Oral)  Resp 16  Ht 5\' 1"  (1.549 m)  Wt 140 lb (63.504 kg)  BMI 26.47 kg/m2  SpO2 94%  LMP 02/19/2014 Physical Exam CONSTITUTIONAL: Well developed/well nourished HEAD: Normocephalic/atraumatic EYES: EOMI/PERRL ENMT: Mucous membranes moist NECK: supple no meningeal signs SPINE:entire spine nontender lumbar paraspinal tenderness, No bruising/crepitance/stepoffs noted to spine CV: S1/S2 noted, no murmurs/rubs/gallops noted LUNGS: Lungs are clear to auscultation bilaterally, no apparent distress ABDOMEN: soft, nontender, no rebound or guarding GU:no cva tenderness NEURO: Awake/alert, equal motor 5/5 strength noted with the following: hip flexion/knee flexion/extension, foot dorsi/plantar flexion, great toe extension intact bilaterally, no clonus bilaterally, plantar reflex appropriate (toes downgoing), no sensory deficit in any dermatome.  Equal patellar/achilles reflex noted (2+) in bilateral lower extremities.  Pt  is able to ambulate unassisted. Pt able to stand on tip toes without difficulty EXTREMITIES: pulses normal, full ROM SKIN: warm, color normal PSYCH: no abnormalities of mood noted   ED Course  Procedures MDM   Final diagnoses:  Right-sided back pain, unspecified location    Nursing notes including past medical history and social history reviewed and considered in documentation   Advised f/u as outpatient, advised use of NSAIDs for her chronic back pain    Sharyon Cable, MD 02/20/14 [phone]

## 2014-02-20 NOTE — Discharge Instructions (Signed)

## 2014-02-20 NOTE — ED Notes (Signed)
Patient states she has had lower back pain x 2-3 months.

## 2014-03-23 ENCOUNTER — Encounter (HOSPITAL_COMMUNITY): Payer: Self-pay | Admitting: Emergency Medicine

## 2014-03-23 ENCOUNTER — Emergency Department (HOSPITAL_COMMUNITY)
Admission: EM | Admit: 2014-03-23 | Discharge: 2014-03-23 | Disposition: A | Discharge status: Home / Self Care | Payer: 59 | Attending: Emergency Medicine | Admitting: Emergency Medicine

## 2014-03-23 DIAGNOSIS — Z8639 Personal history of other endocrine, nutritional and metabolic disease: Secondary | ICD-10-CM | POA: Insufficient documentation

## 2014-03-23 DIAGNOSIS — Z8619 Personal history of other infectious and parasitic diseases: Secondary | ICD-10-CM | POA: Insufficient documentation

## 2014-03-23 DIAGNOSIS — Z3202 Encounter for pregnancy test, result negative: Secondary | ICD-10-CM | POA: Insufficient documentation

## 2014-03-23 DIAGNOSIS — Z79899 Other long term (current) drug therapy: Secondary | ICD-10-CM | POA: Insufficient documentation

## 2014-03-23 DIAGNOSIS — Z880 Allergy status to penicillin: Secondary | ICD-10-CM | POA: Insufficient documentation

## 2014-03-23 DIAGNOSIS — F3289 Other specified depressive episodes: Secondary | ICD-10-CM | POA: Insufficient documentation

## 2014-03-23 DIAGNOSIS — R609 Edema, unspecified: Secondary | ICD-10-CM | POA: Insufficient documentation

## 2014-03-23 DIAGNOSIS — Z862 Personal history of diseases of the blood and blood-forming organs and certain disorders involving the immune mechanism: Secondary | ICD-10-CM | POA: Insufficient documentation

## 2014-03-23 DIAGNOSIS — Z791 Long term (current) use of non-steroidal anti-inflammatories (NSAID): Secondary | ICD-10-CM | POA: Insufficient documentation

## 2014-03-23 DIAGNOSIS — F329 Major depressive disorder, single episode, unspecified: Secondary | ICD-10-CM | POA: Insufficient documentation

## 2014-03-23 LAB — URINALYSIS, ROUTINE W REFLEX MICROSCOPIC
BILIRUBIN URINE: NEGATIVE
Glucose, UA: NEGATIVE mg/dL
Ketones, ur: NEGATIVE mg/dL
Leukocytes, UA: NEGATIVE
Nitrite: NEGATIVE
PROTEIN: NEGATIVE mg/dL
Specific Gravity, Urine: >1.03 — ABNORMAL HIGH (ref 1.005–1.030)
UROBILINOGEN UA: 0.2 mg/dL (ref 0.0–1.0)
pH: 5.5 (ref 5.0–8.0)

## 2014-03-23 LAB — COMPREHENSIVE METABOLIC PANEL
ALT: 48 U/L — ABNORMAL HIGH (ref 0–35)
AST: 34 U/L (ref 0–37)
Albumin: 3.5 g/dL (ref 3.5–5.2)
Alkaline Phosphatase: 53 U/L (ref 39–117)
Anion gap: 11 (ref 5–15)
BUN: 12 mg/dL (ref 6–23)
CALCIUM: 9.2 mg/dL (ref 8.4–10.5)
CHLORIDE: 100 meq/L (ref 96–112)
CO2: 29 mEq/L (ref 19–32)
Creatinine, Ser: 0.68 mg/dL (ref 0.50–1.10)
GLUCOSE: 114 mg/dL — AB (ref 70–99)
Potassium: 3.4 mEq/L — ABNORMAL LOW (ref 3.7–5.3)
Sodium: 140 mEq/L (ref 137–147)
Total Bilirubin: 0.2 mg/dL — ABNORMAL LOW (ref 0.3–1.2)
Total Protein: 7.2 g/dL (ref 6.0–8.3)

## 2014-03-23 LAB — URINE MICROSCOPIC-ADD ON

## 2014-03-23 LAB — CBC WITH DIFFERENTIAL/PLATELET
BASOS ABS: 0.1 10*3/uL (ref 0.0–0.1)
Basophils Relative: 1 % (ref 0–1)
EOS PCT: 10 % — AB (ref 0–5)
Eosinophils Absolute: 0.8 10*3/uL — ABNORMAL HIGH (ref 0.0–0.7)
HCT: 37.2 % (ref 36.0–46.0)
Hemoglobin: 12.9 g/dL (ref 12.0–15.0)
LYMPHS ABS: 3 10*3/uL (ref 0.7–4.0)
LYMPHS PCT: 39 % (ref 12–46)
MCH: 30.5 pg (ref 26.0–34.0)
MCHC: 34.7 g/dL (ref 30.0–36.0)
MCV: 87.9 fL (ref 78.0–100.0)
Monocytes Absolute: 0.7 10*3/uL (ref 0.1–1.0)
Monocytes Relative: 9 % (ref 3–12)
NEUTROS ABS: 3.2 10*3/uL (ref 1.7–7.7)
Neutrophils Relative %: 41 % — ABNORMAL LOW (ref 43–77)
Platelets: 294 10*3/uL (ref 150–400)
RBC: 4.23 MIL/uL (ref 3.87–5.11)
RDW: 12.6 % (ref 11.5–15.5)
WBC: 7.7 10*3/uL (ref 4.0–10.5)

## 2014-03-23 LAB — PREGNANCY, URINE: Preg Test, Ur: NEGATIVE

## 2014-03-23 MED ORDER — POTASSIUM CHLORIDE ER 10 MEQ PO TBCR: 10.0000 meq | EXTENDED_RELEASE_TABLET | Freq: Every day | ORAL | Status: DC

## 2014-03-23 MED ORDER — FUROSEMIDE 20 MG PO TABS: 20.0000 mg | ORAL_TABLET | Freq: Every day | ORAL | Status: DC

## 2014-03-23 NOTE — ED Notes (Signed)
Patient complaining of bilateral upper and lower extremity swelling x 2 months. States "my doctor has me on fluid pills but I only use the bathroom 3 or 4 times a day and still have the swelling. He says that the swelling is from arthritis."

## 2014-03-23 NOTE — ED Notes (Signed)
MD at bedside. 

## 2014-03-23 NOTE — Discharge Instructions (Signed)
Follow up with your primary care physician to reevaluate kidney function and potassium within one week.  You'll be contacted if your thyroid function is abnormal.  Edema Edema is an abnormal buildup of fluids in your bodytissues. Edema is somewhatdependent on gravity to pull the fluid to the lowest place in your body. That makes the condition more common in the legs and thighs (lower extremities). Painless swelling of the feet and ankles is common and becomes more likely as you get older. It is also common in looser tissues, like around your eyes.  When the affected area is squeezed, the fluid may move out of that spot and leave a dent for a few moments. This dent is called pitting.  CAUSES  There are many possible causes of edema. Eating too much salt and being on your feet or sitting for a long time can cause edema in your legs and ankles. Hot weather may make edema worse. Common medical causes of edema include:  Heart failure.  Liver disease.  Kidney disease.  Weak blood vessels in your legs.  Cancer.  An injury.  Pregnancy.  Some medications.  Obesity. SYMPTOMS  Edema is usually painless.Your skin may look swollen or shiny.  DIAGNOSIS  Your health care provider may be able to diagnose edema by asking about your medical history and doing a physical exam. You may need to have tests such as X-rays, an electrocardiogram, or blood tests to check for medical conditions that may cause edema.  TREATMENT  Edema treatment depends on the cause. If you have heart, liver, or kidney disease, you need the treatment appropriate for these conditions. General treatment may include:  Elevation of the affected body part above the level of your heart.  Compression of the affected body part. Pressure from elastic bandages or support stockings squeezes the tissues and forces fluid back into the blood vessels. This keeps fluid from entering the tissues.  Restriction of fluid and salt  intake.  Use of a water pill (diuretic). These medications are appropriate only for some types of edema. They pull fluid out of your body and make you urinate more often. This gets rid of fluid and reduces swelling, but diuretics can have side effects. Only use diuretics as directed by your health care provider. HOME CARE INSTRUCTIONS   Keep the affected body part above the level of your heart when you are lying down.   Do not sit still or stand for prolonged periods.   Do not put anything directly under your knees when lying down.  Do not wear constricting clothing or garters on your upper legs.   Exercise your legs to work the fluid back into your blood vessels. This may help the swelling go down.   Wear elastic bandages or support stockings to reduce ankle swelling as directed by your health care provider.   Eat a low-salt diet to reduce fluid if your health care provider recommends it.   Only take medicines as directed by your health care provider. SEEK MEDICAL CARE IF:   Your edema is not responding to treatment.  You have heart, liver, or kidney disease and notice symptoms of edema.  You have edema in your legs that does not improve after elevating them.   You have sudden and unexplained weight gain. SEEK IMMEDIATE MEDICAL CARE IF:   You develop shortness of breath or chest pain.   You cannot breathe when you lie down.  You develop pain, redness, or warmth in the swollen areas.  You have heart, liver, or kidney disease and suddenly get edema.  You have a fever and your symptoms suddenly get worse. MAKE SURE YOU:   Understand these instructions.  Will watch your condition.  Will get help right away if you are not doing well or get worse. Document Released: 07/08/2005 Document Revised: 11/22/2013 Document Reviewed: 04/30/2013 [facility] Patient Information 2015 [place], [place]. This information is not intended to replace advice given to you by your health  care provider. Make sure you discuss any questions you have with your health care provider.

## 2014-03-23 NOTE — ED Notes (Signed)
Pt c/o swelling to all four extremities. States it has been coming and going for the past 2 months, and her PCP has placed her on fluid pills with no relief.

## 2014-03-23 NOTE — ED Provider Notes (Addendum)
CSN: [id number]     Arrival date & time 03/23/14  1833 History   First MD Initiated Contact with Patient 03/23/14 2036     Chief Complaint  Patient presents with  . Dysuria  . Edema      HPI  Presents with "swelling". States she's had weight gain of about 40 pounds over 4 months. She's her primary care physician for this. Peripheral edema noted over the last few weeks. Started on hydrochlorothiazide a week ago. Not getting better. States her hands and legs felt stiff and swollen. No cough. No PND orthopnea. No chest pain. History of thyroid, kidney, heart, or liver disorders. Not on calcium channel blockers, or any other medications that your risk for causing edema. Denies pregnancy.  Past Medical History  Diagnosis Date  . Pinworms   . Depression   . Pituitary tumor    Past Surgical History  Procedure Laterality Date  . Cesarean section      x2  . Appendectomy    . Pinched nerve rt arm.    . Ectopic pregnancy surgery    . Tubal ligation     Family History  Problem Relation Age of Onset  . Diabetes Mother    History  Substance Use Topics  . Smoking status: Never Smoker   . Smokeless tobacco: Never Used  . Alcohol Use: No   OB History   Grav Para Term Preterm Abortions TAB SAB Ect Mult Living   5 2 2  3  2 1  2      Review of Systems  Constitutional: Positive for unexpected weight change. Negative for fever, chills, diaphoresis, appetite change and fatigue.  HENT: Negative for mouth sores, sore throat and trouble swallowing.   Eyes: Negative for visual disturbance.  Respiratory: Negative for cough, chest tightness, shortness of breath and wheezing.   Cardiovascular: Positive for leg swelling. Negative for chest pain.  Gastrointestinal: Negative for nausea, vomiting, abdominal pain, diarrhea and abdominal distention.  Endocrine: Negative for polydipsia, polyphagia and polyuria.  Genitourinary: Negative for dysuria, frequency and hematuria.  Musculoskeletal: Negative  for gait problem.  Skin: Negative for color change, pallor and rash.  Neurological: Negative for dizziness, syncope, light-headedness and headaches.  Hematological: Does not bruise/bleed easily.  Psychiatric/Behavioral: Negative for behavioral problems and confusion.      Allergies  Amoxicillin and Penicillins  Home Medications   Prior to Admission medications   Medication Sig Start Date End Date Taking? Authorizing Provider  gabapentin (NEURONTIN) 300 MG capsule Take 1 capsule (300 mg total) by mouth 3 (three) times daily. For mood stabilization and withdrawal symptoms. 10/14/13  Yes Nena Polio, PA-C  hydrochlorothiazide (HYDRODIURIL) 25 MG tablet Take 25 mg by mouth daily.   Yes Historical Provider, MD  meloxicam (MOBIC) 7.5 MG tablet Take 7.5 mg by mouth daily.   Yes Historical Provider, MD  potassium chloride SA (K-DUR,KLOR-CON) 20 MEQ tablet Take 20 mEq by mouth once.   Yes Historical Provider, MD  QUEtiapine (SEROQUEL) 50 MG tablet Take 50 mg by mouth at bedtime.   Yes Historical Provider, MD  sertraline (ZOLOFT) 100 MG tablet Take 1 tablet (100 mg total) by mouth daily. For major depressive disorder and anxiety. 10/14/13  Yes Nena Polio, PA-C  traZODone (DESYREL) 150 MG tablet Take 150 mg by mouth at bedtime.   Yes Historical Provider, MD  furosemide (LASIX) 20 MG tablet Take 1 tablet (20 mg total) by mouth daily. 03/23/14   Tanna Furry, MD  potassium chloride (K-DUR) 10 MEQ  tablet Take 1 tablet (10 mEq total) by mouth daily. 03/23/14   Tanna Furry, MD   BP 114/70  Pulse 77  Temp(Src) 98.7 F (37.1 C) (Oral)  Resp 18  Ht 5\' 1"  (1.549 m)  Wt 160 lb (72.576 kg)  BMI 30.25 kg/m2  SpO2 96%  LMP 03/22/2014 Physical Exam  Constitutional: She is oriented to person, place, and time. She appears well-developed and well-nourished. No distress.  HENT:  Head: Normocephalic.  Eyes: Conjunctivae are normal. Pupils are equal, round, and reactive to light. No scleral icterus.  Neck:  Normal range of motion. Neck supple. No thyromegaly present.  No palpable thyroid enlargement  Cardiovascular: Normal rate and regular rhythm.  Exam reveals no gallop and no friction rub.   No murmur heard. Sinus rhythm. No murmurs auscultated on exam.  No S3 or 4 gallop.  Pulmonary/Chest: Effort normal and breath sounds normal. No respiratory distress. She has no wheezes. She has no rales.  Lungs. No crackles or rales.  Abdominal: Soft. Bowel sounds are normal. She exhibits no distension. There is no tenderness. There is no rebound.  Musculoskeletal: Normal range of motion.  Lymphadenopathy:  Bilateral extremity edema 1+. Trace bilateral hand and finger edema.  Neurological: She is alert and oriented to person, place, and time.  Skin: Skin is warm and dry. No rash noted.  Psychiatric: She has a normal mood and affect. Her behavior is normal.    ED Course  Procedures (including critical care time) Labs Review Labs Reviewed  CBC WITH DIFFERENTIAL - Abnormal; Notable for the following:    Neutrophils Relative % 41 (*)    Eosinophils Relative 10 (*)    Eosinophils Absolute 0.8 (*)    All other components within normal limits  COMPREHENSIVE METABOLIC PANEL - Abnormal; Notable for the following:    Potassium 3.4 (*)    Glucose, Bld 114 (*)    ALT 48 (*)    Total Bilirubin 0.2 (*)    All other components within normal limits  URINALYSIS, ROUTINE W REFLEX MICROSCOPIC - Abnormal; Notable for the following:    Specific Gravity, Urine >1.030 (*)    Hgb urine dipstick TRACE (*)    All other components within normal limits  URINE MICROSCOPIC-ADD ON - Abnormal; Notable for the following:    Squamous Epithelial / LPF FEW (*)    All other components within normal limits  PREGNANCY, URINE  TSH    Imaging Review No results found.   EKG Interpretation None      MDM   Final diagnoses:  Edema   Patient reports a history of pituitary tumor. I do not feel this needs any further  addressing tonight in the emergency room. Cannot tell me the specifics regarding this. I have added a TSH. No electrolyte abnormalities to suggest SIADH. Not pregnant. Normal urine. ALT slightly up at 48. Otherwise normal hepatobiliary enzymes. Likely not congestive heart failure no crackles rales. Young healthy. No cardiac history. No medications to cause edema. Plan will be Lasix and potassium. Primary care followup. TSH followup.    Tanna Furry, MD 03/23/14 2993  Tanna Furry, MD 03/23/14 2221

## 2014-03-24 LAB — TSH: TSH: 1.64 u[IU]/mL (ref 0.350–4.500)

## 2014-03-28 ENCOUNTER — Emergency Department (HOSPITAL_COMMUNITY): Payer: 59

## 2014-03-28 ENCOUNTER — Emergency Department (HOSPITAL_COMMUNITY)
Admission: EM | Admit: 2014-03-28 | Discharge: 2014-03-28 | Disposition: A | Discharge status: Home / Self Care | Payer: 59 | Attending: Emergency Medicine | Admitting: Emergency Medicine

## 2014-03-28 ENCOUNTER — Encounter (HOSPITAL_COMMUNITY): Payer: Self-pay | Admitting: Emergency Medicine

## 2014-03-28 DIAGNOSIS — F329 Major depressive disorder, single episode, unspecified: Secondary | ICD-10-CM | POA: Insufficient documentation

## 2014-03-28 DIAGNOSIS — Z8639 Personal history of other endocrine, nutritional and metabolic disease: Secondary | ICD-10-CM | POA: Insufficient documentation

## 2014-03-28 DIAGNOSIS — R609 Edema, unspecified: Secondary | ICD-10-CM

## 2014-03-28 DIAGNOSIS — Z862 Personal history of diseases of the blood and blood-forming organs and certain disorders involving the immune mechanism: Secondary | ICD-10-CM | POA: Insufficient documentation

## 2014-03-28 DIAGNOSIS — Z880 Allergy status to penicillin: Secondary | ICD-10-CM | POA: Insufficient documentation

## 2014-03-28 DIAGNOSIS — F3289 Other specified depressive episodes: Secondary | ICD-10-CM | POA: Insufficient documentation

## 2014-03-28 DIAGNOSIS — Z8619 Personal history of other infectious and parasitic diseases: Secondary | ICD-10-CM | POA: Insufficient documentation

## 2014-03-28 DIAGNOSIS — R0602 Shortness of breath: Secondary | ICD-10-CM | POA: Insufficient documentation

## 2014-03-28 DIAGNOSIS — R5381 Other malaise: Secondary | ICD-10-CM | POA: Insufficient documentation

## 2014-03-28 DIAGNOSIS — R062 Wheezing: Secondary | ICD-10-CM | POA: Insufficient documentation

## 2014-03-28 DIAGNOSIS — Z791 Long term (current) use of non-steroidal anti-inflammatories (NSAID): Secondary | ICD-10-CM | POA: Insufficient documentation

## 2014-03-28 DIAGNOSIS — Z79899 Other long term (current) drug therapy: Secondary | ICD-10-CM | POA: Insufficient documentation

## 2014-03-28 DIAGNOSIS — R5383 Other fatigue: Secondary | ICD-10-CM

## 2014-03-28 DIAGNOSIS — M7989 Other specified soft tissue disorders: Secondary | ICD-10-CM | POA: Insufficient documentation

## 2014-03-28 LAB — CBC WITH DIFFERENTIAL/PLATELET
Basophils Absolute: 0.1 10*3/uL (ref 0.0–0.1)
Basophils Relative: 1 % (ref 0–1)
EOS PCT: 6 % — AB (ref 0–5)
Eosinophils Absolute: 0.6 10*3/uL (ref 0.0–0.7)
HCT: 37.3 % (ref 36.0–46.0)
Hemoglobin: 12.9 g/dL (ref 12.0–15.0)
LYMPHS PCT: 27 % (ref 12–46)
Lymphs Abs: 2.6 10*3/uL (ref 0.7–4.0)
MCH: 30.5 pg (ref 26.0–34.0)
MCHC: 34.6 g/dL (ref 30.0–36.0)
MCV: 88.2 fL (ref 78.0–100.0)
Monocytes Absolute: 0.7 10*3/uL (ref 0.1–1.0)
Monocytes Relative: 7 % (ref 3–12)
NEUTROS PCT: 59 % (ref 43–77)
Neutro Abs: 5.7 10*3/uL (ref 1.7–7.7)
PLATELETS: 283 10*3/uL (ref 150–400)
RBC: 4.23 MIL/uL (ref 3.87–5.11)
RDW: 12.5 % (ref 11.5–15.5)
WBC: 9.6 10*3/uL (ref 4.0–10.5)

## 2014-03-28 LAB — BASIC METABOLIC PANEL
ANION GAP: 10 (ref 5–15)
BUN: 9 mg/dL (ref 6–23)
CALCIUM: 8.9 mg/dL (ref 8.4–10.5)
CO2: 32 meq/L (ref 19–32)
Chloride: 100 mEq/L (ref 96–112)
Creatinine, Ser: 0.76 mg/dL (ref 0.50–1.10)
GFR calc Af Amer: >90 mL/min (ref >90)
GFR calc non Af Amer: >90 mL/min (ref >90)
Glucose, Bld: 130 mg/dL — ABNORMAL HIGH (ref 70–99)
POTASSIUM: 3.4 meq/L — AB (ref 3.7–5.3)
SODIUM: 142 meq/L (ref 137–147)

## 2014-03-28 LAB — PRO B NATRIURETIC PEPTIDE: Pro B Natriuretic peptide (BNP): 143.7 pg/mL — ABNORMAL HIGH (ref 0–125)

## 2014-03-28 MED ORDER — FUROSEMIDE 10 MG/ML IJ SOLN
40.0000 mg | Freq: Once | INTRAMUSCULAR | Status: AC
  Administered 2014-03-28: 40 mg via INTRAVENOUS
  Filled 2014-03-28: qty 4

## 2014-03-28 MED ORDER — FUROSEMIDE 10 MG/ML IJ SOLN: 40.0000 mg | Freq: Once | INTRAMUSCULAR | Status: DC

## 2014-03-28 MED ORDER — POTASSIUM CHLORIDE CRYS ER 20 MEQ PO TBCR
20.0000 meq | EXTENDED_RELEASE_TABLET | Freq: Once | ORAL | Status: AC
  Administered 2014-03-28: 20 meq via ORAL
  Filled 2014-03-28: qty 1

## 2014-03-28 NOTE — ED Notes (Addendum)
Pt c/o of swelling "all over" x 2 months.  Pt also c/o SOB x 4 days, but worse over the past couple days. Reports no SOB at rest, but gets worse with activity or even excessive talking.  Pt states back and legs have started to hurt since swelling.  Pt denies any hx of heart problems but both parents have a hx of hear problems (unsure specific dx).  Denies any hx of respiratory problems.  Pt states she feels dehydrated.  States her lips and mouth feel dry and she has difficulty urinating. NAD, respirations equal and unlabored, skin warm and dry.

## 2014-03-28 NOTE — ED Notes (Signed)
Pt presents to the ED c/o of "swelling for the past two months." Pt states that she has begun to feel SOB q 2 days. Pt states she is on Lasix, but still struggles to pee. NAD noted in triage.

## 2014-03-28 NOTE — ED Provider Notes (Signed)
CSN: [id number]     Arrival date & time 03/28/14  1209 History  This chart was scribed for Maudry Diego, MD by Lowella Petties, ED Scribe. The patient was seen in room APA04/APA04. Patient's care was started at 1:54 PM.   Chief Complaint  Patient presents with  . Leg Swelling    generalized    Patient is a 33 y.o. female presenting with leg pain and shortness of breath. The history is provided by the patient. No language interpreter was used.  Leg Pain Location:  Leg Time since incident:  2 months Injury: no   Leg location:  L leg and R leg Pain details:    Quality:  Dull   Severity:  Moderate   Onset quality:  Gradual   Duration:  2 months   Timing:  Constant   Progression:  Worsening Chronicity:  New Dislocation: no   Relieved by:  Nothing Worsened by:  Nothing tried Ineffective treatments:  None tried Associated symptoms: fatigue and swelling (bilateral)   Associated symptoms: no back pain   Shortness of Breath Severity:  Mild Onset quality:  Sudden Duration:  2 days Timing:  Constant Progression:  Unchanged Chronicity:  New Relieved by:  Nothing Worsened by:  Nothing tried Ineffective treatments:  None tried Associated symptoms: no abdominal pain, no chest pain, no cough, no headaches and no rash    HPI Comments: [name] is a 33 y.o. female who presents to the Emergency Department complaining of bilateral leg swelling onset two months ago. She reports associated SOB onset two days ago that brought her in today. She reports associated generalized weakness and dry mouth. She reports that she was seen here for her leg swelling two weeks ago. She denies productive cough. She reports seeing her PCP about this two months ago   VFI:EPPIR,JJOACZY, MD  Past Medical History  Diagnosis Date  . Pinworms   . Depression   . Pituitary tumor    Past Surgical History  Procedure Laterality Date  . Cesarean section      x2  . Appendectomy    . Pinched nerve rt arm.     . Ectopic pregnancy surgery    . Tubal ligation     Family History  Problem Relation Age of Onset  . Diabetes Mother    History  Substance Use Topics  . Smoking status: Never Smoker   . Smokeless tobacco: Never Used  . Alcohol Use: No   OB History   Grav Para Term Preterm Abortions TAB SAB Ect Mult Living   5 2 2  3  2 1  2      Review of Systems  Constitutional: Positive for fatigue. Negative for appetite change.  HENT: Negative for congestion, ear discharge and sinus pressure.   Eyes: Negative for discharge.  Respiratory: Positive for shortness of breath. Negative for cough.   Cardiovascular: Positive for leg swelling. Negative for chest pain.  Gastrointestinal: Negative for abdominal pain and diarrhea.  Genitourinary: Negative for frequency and hematuria.  Musculoskeletal: Positive for myalgias (bilateral legs). Negative for back pain.  Skin: Negative for rash.  Neurological: Negative for seizures and headaches.  Psychiatric/Behavioral: Negative for hallucinations.    Allergies  Amoxicillin and Penicillins  Home Medications   Prior to Admission medications   Medication Sig Start Date End Date Taking? Authorizing Provider  furosemide (LASIX) 20 MG tablet Take 1 tablet (20 mg total) by mouth daily. 03/23/14  Yes Tanna Furry, MD  gabapentin (NEURONTIN) 300  MG capsule Take 1 capsule (300 mg total) by mouth 3 (three) times daily. For mood stabilization and withdrawal symptoms. 10/14/13  Yes Nena Polio, PA-C  hydrochlorothiazide (HYDRODIURIL) 25 MG tablet Take 25 mg by mouth daily.   Yes Historical Provider, MD  meloxicam (MOBIC) 7.5 MG tablet Take 7.5 mg by mouth daily.   Yes Historical Provider, MD  potassium chloride (K-DUR) 10 MEQ tablet Take 1 tablet (10 mEq total) by mouth daily. 03/23/14  Yes Tanna Furry, MD  QUEtiapine (SEROQUEL) 50 MG tablet Take 50 mg by mouth at bedtime.   Yes Historical Provider, MD  sertraline (ZOLOFT) 100 MG tablet Take 1 tablet (100 mg total)  by mouth daily. For major depressive disorder and anxiety. 10/14/13  Yes Nena Polio, PA-C  traZODone (DESYREL) 150 MG tablet Take 150 mg by mouth at bedtime.   Yes Historical Provider, MD   Triage Vitals: BP 124/69  Pulse 99  Temp(Src) 99.7 F (37.6 C) (Oral)  Resp 20  Ht 5\' 1"  (1.549 m)  Wt 162 lb (73.483 kg)  BMI 30.63 kg/m2  SpO2 100%  LMP 03/22/2014  Physical Exam  Constitutional: She is oriented to person, place, and time. She appears well-developed.  HENT:  Head: Normocephalic.  Eyes: Conjunctivae and EOM are normal. No scleral icterus.  Neck: Neck supple. No thyromegaly present.  Cardiovascular: Normal rate and regular rhythm.  Exam reveals no gallop and no friction rub.   No murmur heard. Pulmonary/Chest: No stridor. She has wheezes (minimal bilateral). She has no rales. She exhibits no tenderness.  Abdominal: She exhibits no distension. There is no tenderness. There is no rebound.  Musculoskeletal: Normal range of motion. She exhibits no edema.  1+ Edema bilaterally from her knees distally.  Lymphadenopathy:    She has no cervical adenopathy.  Neurological: She is oriented to person, place, and time. She exhibits normal muscle tone. Coordination normal.  Skin: No rash noted. No erythema.  Psychiatric: She has a normal mood and affect. Her behavior is normal.    ED Course  Procedures (including critical care time) DIAGNOSTIC STUDIES: Oxygen Saturation is 100% on room air, normal by my interpretation.    COORDINATION OF CARE: 1:57 PM-Discussed treatment plan which includes CXR and lab work with pt at bedside and pt agreed to plan.   Labs Review Labs Reviewed  CBC WITH DIFFERENTIAL - Abnormal; Notable for the following:    Eosinophils Relative 6 (*)    All other components within normal limits  BASIC METABOLIC PANEL - Abnormal; Notable for the following:    Potassium 3.4 (*)    Glucose, Bld 130 (*)    All other components within normal limits  PRO B  NATRIURETIC PEPTIDE    Imaging Review Dg Chest 2 View  03/28/2014   CLINICAL DATA:  Cough, chest pain, shortness of breath, fever  EXAM: CHEST  2 VIEW  COMPARISON:  10/24/2006  FINDINGS: The heart size and mediastinal contours are within normal limits. Both lungs are clear. The visualized skeletal structures are unremarkable.  IMPRESSION: No active cardiopulmonary disease.   Electronically Signed   By: Conchita Paris M.D.   On: 03/28/2014 13:24     EKG Interpretation None      MDM   Final diagnoses:  None   Edema.  Increase lasix and follow up with pcp   The chart was scribed for me under my direct supervision.  I personally performed the history, physical, and medical decision making and all procedures in the evaluation  of this patient.Maudry Diego, MD 03/28/14 [phone]

## 2014-03-28 NOTE — ED Notes (Signed)
Pt c/o dizziness and nausea at 15:14.  VS checked.  Pulse was initially fast at 100, but after a few minutes, pulse lowered to 93.  BP 121/81.  Pt had just returned to room after going to the bathroom.

## 2014-03-28 NOTE — ED Notes (Signed)
Pt reports some improvement in swelling in hands.  Ankles look less edematous upon inspection.  Pt c/o of headache after dizziness episode (rated 6/10).

## 2014-03-28 NOTE — Discharge Instructions (Signed)
Follow up with your md this week.  Increase the lasix to 2 pills a day for 3 days

## 2014-05-23 ENCOUNTER — Encounter (HOSPITAL_COMMUNITY): Payer: Self-pay | Admitting: Emergency Medicine

## 2014-05-27 ENCOUNTER — Emergency Department (HOSPITAL_COMMUNITY)
Admission: EM | Admit: 2014-05-27 | Discharge: 2014-05-27 | Disposition: A | Discharge status: Home / Self Care | Payer: Self-pay | Attending: Emergency Medicine | Admitting: Emergency Medicine

## 2014-05-27 ENCOUNTER — Emergency Department (HOSPITAL_COMMUNITY): Payer: 59

## 2014-05-27 ENCOUNTER — Encounter (HOSPITAL_COMMUNITY): Payer: Self-pay | Admitting: Emergency Medicine

## 2014-05-27 DIAGNOSIS — Z9851 Tubal ligation status: Secondary | ICD-10-CM | POA: Insufficient documentation

## 2014-05-27 DIAGNOSIS — Z8639 Personal history of other endocrine, nutritional and metabolic disease: Secondary | ICD-10-CM | POA: Insufficient documentation

## 2014-05-27 DIAGNOSIS — Z8619 Personal history of other infectious and parasitic diseases: Secondary | ICD-10-CM | POA: Insufficient documentation

## 2014-05-27 DIAGNOSIS — Z9089 Acquired absence of other organs: Secondary | ICD-10-CM | POA: Insufficient documentation

## 2014-05-27 DIAGNOSIS — Z3202 Encounter for pregnancy test, result negative: Secondary | ICD-10-CM | POA: Insufficient documentation

## 2014-05-27 DIAGNOSIS — Z9889 Other specified postprocedural states: Secondary | ICD-10-CM | POA: Insufficient documentation

## 2014-05-27 DIAGNOSIS — R112 Nausea with vomiting, unspecified: Secondary | ICD-10-CM | POA: Insufficient documentation

## 2014-05-27 DIAGNOSIS — N939 Abnormal uterine and vaginal bleeding, unspecified: Secondary | ICD-10-CM | POA: Insufficient documentation

## 2014-05-27 DIAGNOSIS — F329 Major depressive disorder, single episode, unspecified: Secondary | ICD-10-CM | POA: Insufficient documentation

## 2014-05-27 DIAGNOSIS — Z880 Allergy status to penicillin: Secondary | ICD-10-CM | POA: Insufficient documentation

## 2014-05-27 DIAGNOSIS — Z79899 Other long term (current) drug therapy: Secondary | ICD-10-CM | POA: Insufficient documentation

## 2014-05-27 DIAGNOSIS — R102 Pelvic and perineal pain: Secondary | ICD-10-CM | POA: Insufficient documentation

## 2014-05-27 LAB — COMPREHENSIVE METABOLIC PANEL
ALT: 23 U/L (ref 0–35)
AST: 19 U/L (ref 0–37)
Albumin: 4.2 g/dL (ref 3.5–5.2)
Alkaline Phosphatase: 54 U/L (ref 39–117)
Anion gap: 13 (ref 5–15)
BUN: 15 mg/dL (ref 6–23)
CALCIUM: 9.5 mg/dL (ref 8.4–10.5)
CO2: 29 mEq/L (ref 19–32)
CREATININE: 0.77 mg/dL (ref 0.50–1.10)
Chloride: 101 mEq/L (ref 96–112)
GFR calc Af Amer: >90 mL/min (ref >90)
Glucose, Bld: 94 mg/dL (ref 70–99)
Potassium: 3.6 mEq/L — ABNORMAL LOW (ref 3.7–5.3)
Sodium: 143 mEq/L (ref 137–147)
Total Bilirubin: 0.3 mg/dL (ref 0.3–1.2)
Total Protein: 7.8 g/dL (ref 6.0–8.3)

## 2014-05-27 LAB — CBC WITH DIFFERENTIAL/PLATELET
BASOS ABS: 0.1 10*3/uL (ref 0.0–0.1)
BASOS PCT: 1 % (ref 0–1)
EOS ABS: 0.3 10*3/uL (ref 0.0–0.7)
EOS PCT: 4 % (ref 0–5)
HEMATOCRIT: 39.1 % (ref 36.0–46.0)
Hemoglobin: 13.4 g/dL (ref 12.0–15.0)
Lymphocytes Relative: 41 % (ref 12–46)
Lymphs Abs: 2.7 10*3/uL (ref 0.7–4.0)
MCH: 30 pg (ref 26.0–34.0)
MCHC: 34.3 g/dL (ref 30.0–36.0)
MCV: 87.5 fL (ref 78.0–100.0)
Monocytes Absolute: 0.4 10*3/uL (ref 0.1–1.0)
Monocytes Relative: 7 % (ref 3–12)
Neutro Abs: 3 10*3/uL (ref 1.7–7.7)
Neutrophils Relative %: 47 % (ref 43–77)
Platelets: 317 10*3/uL (ref 150–400)
RBC: 4.47 MIL/uL (ref 3.87–5.11)
RDW: 12.1 % (ref 11.5–15.5)
WBC: 6.4 10*3/uL (ref 4.0–10.5)

## 2014-05-27 LAB — URINALYSIS, ROUTINE W REFLEX MICROSCOPIC
Glucose, UA: NEGATIVE mg/dL
KETONES UR: NEGATIVE mg/dL
Leukocytes, UA: NEGATIVE
NITRITE: NEGATIVE
Specific Gravity, Urine: 1.025 (ref 1.005–1.030)
Urobilinogen, UA: 0.2 mg/dL (ref 0.0–1.0)
pH: 6 (ref 5.0–8.0)

## 2014-05-27 LAB — WET PREP, GENITAL
Clue Cells Wet Prep HPF POC: NONE SEEN
TRICH WET PREP: NONE SEEN
YEAST WET PREP: NONE SEEN

## 2014-05-27 LAB — HIV ANTIBODY (ROUTINE TESTING W REFLEX): HIV 1&2 Ab, 4th Generation: NONREACTIVE

## 2014-05-27 LAB — URINE MICROSCOPIC-ADD ON

## 2014-05-27 LAB — PREGNANCY, URINE: Preg Test, Ur: NEGATIVE

## 2014-05-27 MED ORDER — SODIUM CHLORIDE 0.9 % IV BOLUS (SEPSIS)
1000.0000 mL | Freq: Once | INTRAVENOUS | Status: AC
  Administered 2014-05-27: 1000 mL via INTRAVENOUS

## 2014-05-27 MED ORDER — ONDANSETRON HCL 4 MG/2ML IJ SOLN
4.0000 mg | Freq: Once | INTRAMUSCULAR | Status: AC
  Administered 2014-05-27: 4 mg via INTRAVENOUS
  Filled 2014-05-27: qty 2

## 2014-05-27 MED ORDER — METOCLOPRAMIDE HCL 5 MG/ML IJ SOLN
10.0000 mg | Freq: Once | INTRAMUSCULAR | Status: AC
  Administered 2014-05-27: 10 mg via INTRAVENOUS
  Filled 2014-05-27: qty 2

## 2014-05-27 MED ORDER — KETOROLAC TROMETHAMINE 30 MG/ML IJ SOLN
30.0000 mg | Freq: Once | INTRAMUSCULAR | Status: AC
  Administered 2014-05-27: 30 mg via INTRAVENOUS
  Filled 2014-05-27: qty 1

## 2014-05-27 MED ORDER — PROMETHAZINE HCL 25 MG PO TABS: 25.0000 mg | ORAL_TABLET | Freq: Four times a day (QID) | ORAL | Status: DC | PRN

## 2014-05-27 MED ORDER — AZITHROMYCIN 250 MG PO TABS
1000.0000 mg | ORAL_TABLET | Freq: Once | ORAL | Status: AC
  Administered 2014-05-27: 1000 mg via ORAL
  Filled 2014-05-27: qty 4

## 2014-05-27 MED ORDER — DOXYCYCLINE HYCLATE 100 MG PO CAPS: 100.0000 mg | ORAL_CAPSULE | Freq: Two times a day (BID) | ORAL | Status: DC

## 2014-05-27 NOTE — ED Notes (Signed)
PT c/o lower abdominal pain with n/v x3 days. PT also c/o vaginal discharge and ithcing with some urinary retention.

## 2014-05-27 NOTE — Discharge Instructions (Signed)
Pelvic Pain Pelvic pain is pain felt below the belly button and between your hips. It can be caused by many different things. It is important to get help right away. This is especially true for severe, sharp, or unusual pain that comes on suddenly.  HOME CARE  Only take medicine as told by your doctor.  Rest as told by your doctor.  Eat a healthy diet, such as fruits, vegetables, and lean meats.  Drink enough fluids to keep your pee (urine) clear or pale yellow, or as told.  Avoid sex (intercourse) if it causes pain.  Apply warm or cold packs to your lower belly (abdomen). Use the type of pack that helps the pain.  Avoid situations that cause you stress.  Keep a journal to track your pain. Write down:  When the pain started.  Where it is located.  If there are things that seem to be related to the pain, such as food or your period.  Follow up with your doctor as told. GET HELP RIGHT AWAY IF:   You have heavy bleeding from the vagina.  You have more pelvic pain.  You feel lightheaded or pass out (faint).  You have chills.  You have pain when you pee or have blood in your pee.  You cannot stop having watery poop (diarrhea).  You cannot stop throwing up (vomiting).  You have a fever or lasting symptoms for more than 3 days.  You have a fever and your symptoms suddenly get worse.  You are being physically or sexually abused.  Your medicine does not help your pain.  You have fluid (discharge) coming from your vagina that is not normal. MAKE SURE YOU:  Understand these instructions.  Will watch your condition.  Will get help if you are not doing well or get worse. Document Released: 12/25/2007 Document Revised: 01/07/2012 Document Reviewed: 10/28/2011 [employer] Patient Information 2015 [employer]. This information is not intended to replace advice given to you by your health care provider. Make sure you discuss any questions you have with your health care  provider.  

## 2014-05-27 NOTE — ED Notes (Signed)
Notified us that utltrasound task was accidentally clicked "completed." Staff aware and are coming to pick up pt.

## 2014-05-27 NOTE — ED Provider Notes (Signed)
Complains of low abdominal pain at left suprapubic, left lower quadrant area for the past 3-4 days, nonradiating. Symptoms accompanied by vomiting 8 or 9 times yesterday and vomiting 3 times today.she denies symptoms of urinary retention. She admits to urgency. She is presently hungry. On exam patient is alert Glasgow Coma Score 15abdomen nondistended. Tender at left lower quadrant and left suprapubic area no guarding no rigidity  Orlie Dakin, MD 05/27/14 [phone]

## 2014-05-27 NOTE — ED Provider Notes (Signed)
CSN: [id number]     Arrival date & time 05/27/14  1025 History   First MD Initiated Contact with Patient 05/27/14 1224     Chief Complaint  Patient presents with  . Abdominal Pain     (Consider location/radiation/quality/duration/timing/severity/associated sxs/prior Treatment) HPI   [name] is a 33 y.o. female who presents to the Emergency Department complaining of crampy lower abdominal pain for 3 days.  She reports nausea and intermittent vomiting with vaginal discharge prior to onset of pain and reports vaginal bleeding for 1-2 days and states her menstrual period was end of October.  She also reports having burning with urination with increased sense of urgency for one day.  She denies fever, diarrhea, blood in her urine, new sexual partners, inability to urinate or flank pain.     Past Medical History  Diagnosis Date  . Pinworms   . Depression   . Pituitary tumor    Past Surgical History  Procedure Laterality Date  . Cesarean section      x2  . Appendectomy    . Pinched nerve rt arm.    . Ectopic pregnancy surgery    . Tubal ligation     Family History  Problem Relation Age of Onset  . Diabetes Mother    History  Substance Use Topics  . Smoking status: Never Smoker   . Smokeless tobacco: Never Used  . Alcohol Use: No   OB History    Gravida Para Term Preterm AB TAB SAB Ectopic Multiple Living   5 2 2  3  2 1  2      Review of Systems  Constitutional: Negative for fever, chills, activity change and appetite change.  Respiratory: Negative for chest tightness and shortness of breath.   Cardiovascular: Negative for chest pain.  Gastrointestinal: Positive for nausea, vomiting and abdominal pain. Negative for diarrhea and abdominal distention.  Genitourinary: Positive for dysuria, urgency, vaginal bleeding, vaginal discharge and pelvic pain. Negative for hematuria and flank pain.  Musculoskeletal: Negative for back pain, neck pain and neck stiffness.   Skin: Negative for rash.  Neurological: Negative for dizziness, syncope and light-headedness.  Psychiatric/Behavioral: Negative for confusion.  All other systems reviewed and are negative.     Allergies  Amoxicillin and Penicillins  Home Medications   Prior to Admission medications   Medication Sig Start Date End Date Taking? Authorizing Provider  amphetamine-dextroamphetamine (ADDERALL) 10 MG tablet Take 10 mg by mouth 2 (two) times daily with a meal.   Yes Historical Provider, MD  gabapentin (NEURONTIN) 300 MG capsule Take 1 capsule (300 mg total) by mouth 3 (three) times daily. For mood stabilization and withdrawal symptoms. 10/14/13  Yes Nena Polio, PA-C  ibuprofen (ADVIL,MOTRIN) 200 MG tablet Take 600 mg by mouth every 6 (six) hours as needed for moderate pain.   Yes Historical Provider, MD  QUEtiapine (SEROQUEL) 50 MG tablet Take 50 mg by mouth at bedtime.   Yes Historical Provider, MD  sertraline (ZOLOFT) 100 MG tablet Take 1 tablet (100 mg total) by mouth daily. For major depressive disorder and anxiety. 10/14/13  Yes Nena Polio, PA-C  traZODone (DESYREL) 150 MG tablet Take 150 mg by mouth at bedtime.   Yes Historical Provider, MD  furosemide (LASIX) 20 MG tablet Take 1 tablet (20 mg total) by mouth daily. Patient not taking: Reported on 05/27/2014 03/23/14   Tanna Furry, MD  hydrochlorothiazide (HYDRODIURIL) 25 MG tablet Take 25 mg by mouth daily.    Historical Provider, MD  meloxicam (MOBIC) 7.5 MG tablet Take 7.5 mg by mouth daily.    Historical Provider, MD  potassium chloride (K-DUR) 10 MEQ tablet Take 1 tablet (10 mEq total) by mouth daily. Patient not taking: Reported on 05/27/2014 03/23/14   Tanna Furry, MD   BP 101/64 mmHg  Pulse 60  Temp(Src) 98.1 F (36.7 C) (Oral)  Resp 14  Ht 5\' 1"  (1.549 m)  Wt 150 lb (68.04 kg)  BMI 28.36 kg/m2  SpO2 99%  LMP 05/17/2014 Physical Exam  Constitutional: She is oriented to person, place, and time. She appears well-developed and  well-nourished. No distress.  HENT:  Head: Normocephalic and atraumatic.  Mouth/Throat: Oropharynx is clear and moist.  Neck: Normal range of motion. Neck supple.  Cardiovascular: Normal rate, regular rhythm, normal heart sounds and intact distal pulses.   No murmur heard. Pulmonary/Chest: Effort normal and breath sounds normal. No respiratory distress.  Abdominal: Soft. She exhibits no distension and no mass. There is no tenderness. There is no rebound and no guarding.  Genitourinary: There is no lesion on the right labia. There is no lesion on the left labia. Uterus is not enlarged. Cervix exhibits motion tenderness. Cervix exhibits no friability. Right adnexum displays no mass and no tenderness. Left adnexum displays no mass and no tenderness. There is bleeding in the vagina. No vaginal discharge found.  Mild CMT.  No adnexal masses or tenderness.  Small amt of blood present in the vaginal vault.  Musculoskeletal: Normal range of motion.  Lymphadenopathy:    She has no cervical adenopathy.       Right: No inguinal adenopathy present.       Left: No inguinal adenopathy present.  Neurological: She is alert and oriented to person, place, and time. She exhibits normal muscle tone. Coordination normal.  Skin: Skin is warm and dry. No rash noted.  Psychiatric: She has a normal mood and affect. Thought content normal.  Nursing note and vitals reviewed.   ED Course  Procedures (including critical care time) Labs Review Labs Reviewed  WET PREP, GENITAL - Abnormal; Notable for the following:    WBC, Wet Prep HPF POC MODERATE (*)    All other components within normal limits  URINALYSIS, ROUTINE W REFLEX MICROSCOPIC - Abnormal; Notable for the following:    Hgb urine dipstick LARGE (*)    Bilirubin Urine SMALL (*)    Protein, ur TRACE (*)    All other components within normal limits  COMPREHENSIVE METABOLIC PANEL - Abnormal; Notable for the following:    Potassium 3.6 (*)    All other  components within normal limits  GC/CHLAMYDIA PROBE AMP  PREGNANCY, URINE  URINE MICROSCOPIC-ADD ON  CBC WITH DIFFERENTIAL  RPR  HIV ANTIBODY (ROUTINE TESTING)    Imaging Review US Transvaginal Non-ob  05/27/2014   CLINICAL DATA:  33 year old female with acute pelvic pain with nausea and vomiting for 3 days. LMP 05/17/2014. Initial encounter.  EXAM: TRANSABDOMINAL AND TRANSVAGINAL ULTRASOUND OF PELVIS  TECHNIQUE: Both transabdominal and transvaginal ultrasound examinations of the pelvis were performed. Transabdominal technique was performed for global imaging of the pelvis including uterus, ovaries, adnexal regions, and pelvic cul-de-sac. It was necessary to proceed with endovaginal exam following the transabdominal exam to visualize the ovaries.  COMPARISON:  Non contrast CT Abdomen and Pelvis 12/22/2007.  FINDINGS: Uterus  Measurements: 9.0 x 4.3 x 5.5 cm. Incidental nabothian cyst. No fibroids or other mass visualized.  Endometrium  Thickness: 3 mm.  No focal abnormality visualized.  Right  ovary  Measurements: 2.9 x 2.3 x 2.3 cm. Small dominant follicle measuring 9 mm. Normal appearance/no adnexal mass.  Left ovary  Measurements: 3.5 x 2.2 x 2.6 cm. Normal appearance/no adnexal mass.  Other findings  No free fluid.  IMPRESSION: Normal pelvis ultrasound.   Electronically Signed   By: Lars Pinks M.D.   On: 05/27/2014 15:02   US Pelvis Complete  05/27/2014   CLINICAL DATA:  33 year old female with acute pelvic pain with nausea and vomiting for 3 days. LMP 05/17/2014. Initial encounter.  EXAM: TRANSABDOMINAL AND TRANSVAGINAL ULTRASOUND OF PELVIS  TECHNIQUE: Both transabdominal and transvaginal ultrasound examinations of the pelvis were performed. Transabdominal technique was performed for global imaging of the pelvis including uterus, ovaries, adnexal regions, and pelvic cul-de-sac. It was necessary to proceed with endovaginal exam following the transabdominal exam to visualize the ovaries.  COMPARISON:   Non contrast CT Abdomen and Pelvis 12/22/2007.  FINDINGS: Uterus  Measurements: 9.0 x 4.3 x 5.5 cm. Incidental nabothian cyst. No fibroids or other mass visualized.  Endometrium  Thickness: 3 mm.  No focal abnormality visualized.  Right ovary  Measurements: 2.9 x 2.3 x 2.3 cm. Small dominant follicle measuring 9 mm. Normal appearance/no adnexal mass.  Left ovary  Measurements: 3.5 x 2.2 x 2.6 cm. Normal appearance/no adnexal mass.  Other findings  No free fluid.  IMPRESSION: Normal pelvis ultrasound.   Electronically Signed   By: Lars Pinks M.D.   On: 05/27/2014 15:02     EKG Interpretation None       Cultures pending  MDM   Final diagnoses:  Pelvic pain in female    Orthostatic VS for the past 24 hrs:  BP- Lying Pulse- Lying BP- Sitting Pulse- Sitting BP- Standing at 0 minutes Pulse- Standing at 0 minutes  05/27/14 1540 100/69 mmHg 79 108/71 mmHg 55 107/73 mmHg 60    Patient is feeling better after IVF and zofran.  She is non-toxic appearing.  Pelvic pain for 3-4 days with h/o same.  Labs and pelvic and ultrasound are unremarkable. Plan includes empirical treatment with Zithromax single dose given here , Rx Phenergan for nausea, patient agrees to [facility]. She appears stable for discharge. No concerning symptoms for acute abdomen. Patient agrees to return to the ER for worsening symptoms.   Patient also seen by Dr. Winfred Leeds and care plan discussed.   Grabiela Wohlford L. Ammie Ferrier 05/29/14 2046  Orlie Dakin, MD 05/30/14 [phone]

## 2014-05-27 NOTE — ED Notes (Signed)
Vitals reported to PA, pt says feels ok to go home, instructed to drink plenty of fluids at home and return to ed as needed.

## 2014-05-27 NOTE — ED Notes (Signed)
Vitals reported to PA, instructed to recheck in a few min.

## 2014-05-28 LAB — GC/CHLAMYDIA PROBE AMP
CT Probe RNA: NEGATIVE
GC Probe RNA: NEGATIVE

## 2014-05-28 LAB — RPR

## 2017-03-06 ENCOUNTER — Encounter (HOSPITAL_COMMUNITY): Payer: Self-pay | Admitting: Emergency Medicine

## 2017-03-06 ENCOUNTER — Emergency Department (HOSPITAL_COMMUNITY)
Admission: EM | Admit: 2017-03-06 | Discharge: 2017-03-06 | Disposition: A | Discharge status: Home / Self Care | Payer: Medicaid Other | Attending: Emergency Medicine | Admitting: Emergency Medicine

## 2017-03-06 ENCOUNTER — Emergency Department (HOSPITAL_COMMUNITY): Payer: Medicaid Other

## 2017-03-06 DIAGNOSIS — Y92410 Unspecified street and highway as the place of occurrence of the external cause: Secondary | ICD-10-CM | POA: Diagnosis not present

## 2017-03-06 DIAGNOSIS — Z79899 Other long term (current) drug therapy: Secondary | ICD-10-CM | POA: Insufficient documentation

## 2017-03-06 DIAGNOSIS — Y999 Unspecified external cause status: Secondary | ICD-10-CM | POA: Insufficient documentation

## 2017-03-06 DIAGNOSIS — T148XXA Other injury of unspecified body region, initial encounter: Secondary | ICD-10-CM

## 2017-03-06 DIAGNOSIS — S39012A Strain of muscle, fascia and tendon of lower back, initial encounter: Secondary | ICD-10-CM | POA: Diagnosis not present

## 2017-03-06 DIAGNOSIS — S161XXA Strain of muscle, fascia and tendon at neck level, initial encounter: Secondary | ICD-10-CM | POA: Insufficient documentation

## 2017-03-06 DIAGNOSIS — S3992XA Unspecified injury of lower back, initial encounter: Secondary | ICD-10-CM | POA: Diagnosis present

## 2017-03-06 DIAGNOSIS — Z791 Long term (current) use of non-steroidal anti-inflammatories (NSAID): Secondary | ICD-10-CM | POA: Insufficient documentation

## 2017-03-06 DIAGNOSIS — Y939 Activity, unspecified: Secondary | ICD-10-CM | POA: Diagnosis not present

## 2017-03-06 LAB — POC URINE PREG, ED: Preg Test, Ur: NEGATIVE

## 2017-03-06 MED ORDER — IBUPROFEN 800 MG PO TABS
800.0000 mg | ORAL_TABLET | Freq: Once | ORAL | Status: AC
  Administered 2017-03-06: 800 mg via ORAL
  Filled 2017-03-06: qty 1

## 2017-03-06 MED ORDER — ONDANSETRON HCL 4 MG PO TABS
4.0000 mg | ORAL_TABLET | Freq: Once | ORAL | Status: AC
  Administered 2017-03-06: 4 mg via ORAL
  Filled 2017-03-06: qty 1

## 2017-03-06 MED ORDER — IBUPROFEN 600 MG PO TABS: 600.0000 mg | ORAL_TABLET | Freq: Four times a day (QID) | ORAL | 0 refills | Status: DC

## 2017-03-06 MED ORDER — CYCLOBENZAPRINE HCL 10 MG PO TABS
10.0000 mg | ORAL_TABLET | Freq: Once | ORAL | Status: AC
  Administered 2017-03-06: 10 mg via ORAL
  Filled 2017-03-06: qty 1

## 2017-03-06 MED ORDER — CYCLOBENZAPRINE HCL 10 MG PO TABS: 10.0000 mg | ORAL_TABLET | Freq: Three times a day (TID) | ORAL | 0 refills | Status: DC
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
## 2017-03-06 NOTE — Discharge Instructions (Signed)
Your vital signs are within normal limits. Your x-rays are negative for fracture or dislocation. Your examination suggests muscle strain of multiple sites. Heating pad may be helpful. Please rest your neck and back area is much as possible. Use ibuprofen with breakfast, lunch, dinner, and at bedtime. Use Flexeril 3 times daily as needed for spasm pain. Flexeril may cause drowsiness. Please do not drive, drink, use drugs, or operate machinery while taking this medication.

## 2017-03-06 NOTE — ED Notes (Signed)
Pt alert & oriented x4, stable gait. Patient given discharge instructions, paperwork & prescription(s). Patient  instructed to stop at the registration desk to finish any additional paperwork. Patient verbalized understanding. Pt left department w/ no further questions. 

## 2017-09-20 ENCOUNTER — Emergency Department (HOSPITAL_COMMUNITY)
Admission: EM | Admit: 2017-09-20 | Discharge: 2017-09-20 | Disposition: A | Discharge status: Home / Self Care | Payer: Self-pay | Attending: Emergency Medicine | Admitting: Emergency Medicine

## 2017-09-20 ENCOUNTER — Emergency Department (HOSPITAL_COMMUNITY): Payer: Self-pay

## 2017-09-20 ENCOUNTER — Encounter (HOSPITAL_COMMUNITY): Payer: Self-pay

## 2017-09-20 DIAGNOSIS — F419 Anxiety disorder, unspecified: Secondary | ICD-10-CM | POA: Insufficient documentation

## 2017-09-20 DIAGNOSIS — R609 Edema, unspecified: Secondary | ICD-10-CM | POA: Insufficient documentation

## 2017-09-20 DIAGNOSIS — F319 Bipolar disorder, unspecified: Secondary | ICD-10-CM | POA: Insufficient documentation

## 2017-09-20 DIAGNOSIS — Z79899 Other long term (current) drug therapy: Secondary | ICD-10-CM | POA: Insufficient documentation

## 2017-09-20 HISTORY — DX: Anxiety disorder, unspecified: F41.9

## 2017-09-20 HISTORY — DX: Fibromyalgia: M79.7

## 2017-09-20 HISTORY — DX: Bipolar disorder, unspecified: F31.9

## 2017-09-20 LAB — COMPREHENSIVE METABOLIC PANEL
ALBUMIN: 4.1 g/dL (ref 3.5–5.0)
ALT: 44 U/L (ref 14–54)
AST: 32 U/L (ref 15–41)
Alkaline Phosphatase: 59 U/L (ref 38–126)
Anion gap: 8 (ref 5–15)
BILIRUBIN TOTAL: 0.5 mg/dL (ref 0.3–1.2)
BUN: 12 mg/dL (ref 6–20)
CHLORIDE: 98 mmol/L — AB (ref 101–111)
CO2: 29 mmol/L (ref 22–32)
CREATININE: 0.61 mg/dL (ref 0.44–1.00)
Calcium: 8.9 mg/dL (ref 8.9–10.3)
GFR calc Af Amer: >60 mL/min (ref >60)
GLUCOSE: 109 mg/dL — AB (ref 65–99)
POTASSIUM: 3.2 mmol/L — AB (ref 3.5–5.1)
Sodium: 135 mmol/L (ref 135–145)
Total Protein: 7.6 g/dL (ref 6.5–8.1)

## 2017-09-20 LAB — CBC WITH DIFFERENTIAL/PLATELET
Basophils Absolute: 0.1 10*3/uL (ref 0.0–0.1)
Basophils Relative: 1 %
EOS ABS: 0.5 10*3/uL (ref 0.0–0.7)
Eosinophils Relative: 7 %
HCT: 43.3 % (ref 36.0–46.0)
Hemoglobin: 14.3 g/dL (ref 12.0–15.0)
LYMPHS ABS: 2.2 10*3/uL (ref 0.7–4.0)
LYMPHS PCT: 33 %
MCH: 30 pg (ref 26.0–34.0)
MCHC: 33 g/dL (ref 30.0–36.0)
MCV: 90.8 fL (ref 78.0–100.0)
MONO ABS: 0.3 10*3/uL (ref 0.1–1.0)
Monocytes Relative: 4 %
Neutro Abs: 3.8 10*3/uL (ref 1.7–7.7)
Neutrophils Relative %: 55 %
PLATELETS: 297 10*3/uL (ref 150–400)
RBC: 4.77 MIL/uL (ref 3.87–5.11)
RDW: 12.2 % (ref 11.5–15.5)
WBC: 6.8 10*3/uL (ref 4.0–10.5)

## 2017-09-20 LAB — URINALYSIS, ROUTINE W REFLEX MICROSCOPIC
BILIRUBIN URINE: NEGATIVE
GLUCOSE, UA: NEGATIVE mg/dL
HGB URINE DIPSTICK: NEGATIVE
KETONES UR: NEGATIVE mg/dL
Leukocytes, UA: NEGATIVE
Nitrite: NEGATIVE
PROTEIN: NEGATIVE mg/dL
Specific Gravity, Urine: 1.021 (ref 1.005–1.030)
pH: 8 (ref 5.0–8.0)

## 2017-09-20 LAB — WET PREP, GENITAL
Clue Cells Wet Prep HPF POC: NONE SEEN
Sperm: NONE SEEN
Trich, Wet Prep: NONE SEEN
WBC, Wet Prep HPF POC: NONE SEEN
Yeast Wet Prep HPF POC: NONE SEEN

## 2017-09-20 LAB — PREGNANCY, URINE: PREG TEST UR: NEGATIVE

## 2017-09-20 MED ORDER — FUROSEMIDE 40 MG PO TABS
20.0000 mg | ORAL_TABLET | Freq: Once | ORAL | Status: AC
  Administered 2017-09-20: 20 mg via ORAL
  Filled 2017-09-20: qty 1

## 2017-09-20 MED ORDER — FUROSEMIDE 20 MG PO TABS: 20.0000 mg | ORAL_TABLET | Freq: Every day | ORAL | 0 refills | Status: DC

## 2017-09-20 NOTE — ED Notes (Signed)
Pt transported to xray 

## 2017-09-20 NOTE — Discharge Instructions (Signed)
Test showed no life-threatening condition.  Reduce salt in your diet.  Prescription for Lasix to be taken as needed.  Recommend follow-up with your primary care doctor

## 2017-09-20 NOTE — ED Notes (Signed)
Phlebotomy at bedside.

## 2017-09-20 NOTE — ED Provider Notes (Signed)
[facility] EMERGENCY DEPARTMENT Provider Note   CSN: [id number] Arrival date & time: 09/20/17  1311     History   Chief Complaint Chief Complaint  Patient presents with  . swelling    HPI [name] is a 37 y.o. female.  Patient complains of generalized body swelling.  No complaints of chest pain, dyspnea, fever, sweats, chills, rash.  No history of congestive heart failure.  Past medical history includes fibromyalgia, bipolar, polysubstance abuse.  Severity of symptoms is mild.  Nothing makes symptoms better or worse.  Review of systems positive for minimal clear vaginal discharge.  Patient is sexually active with her husband.      Past Medical History:  Diagnosis Date  . Anxiety   . Bipolar 1 disorder ([place])   . Depression   . Fibromyalgia   . Pinworms   . Pituitary tumor     Patient Active Problem List   Diagnosis Date Noted  . Polysubstance (including opioids) dependence with physiological dependence ([place]) 10/05/2013  . Substance induced mood disorder ([place]) 10/05/2013  . MDD (major depressive disorder) 06/13/2012    Past Surgical History:  Procedure Laterality Date  . APPENDECTOMY    . CESAREAN SECTION     x2  . ECTOPIC PREGNANCY SURGERY    . pinched nerve rt arm.    . TUBAL LIGATION      OB History    Gravida Para Term Preterm AB Living   5 2 2   3 2    SAB TAB Ectopic Multiple Live Births   2   1           Home Medications    Prior to Admission medications   Medication Sig Start Date End Date Taking? Authorizing Provider  amphetamine-dextroamphetamine (ADDERALL) 10 MG tablet Take 10 mg by mouth 2 (two) times daily with a meal.   Yes [provider]  gabapentin (NEURONTIN) 300 MG capsule Take 1 capsule (300 mg total) by mouth 3 (three) times daily. For mood stabilization and withdrawal symptoms. 10/14/13  Yes Mashburn, Marlane Hatcher, PA-C  ibuprofen (ADVIL,MOTRIN) 200 MG tablet Take 400 mg by mouth every 6 (six) hours as needed for  moderate pain.   Yes [provider]  meloxicam (MOBIC) 7.5 MG tablet Take 7.5 mg by mouth daily.   Yes [provider]  sertraline (ZOLOFT) 100 MG tablet Take 1 tablet (100 mg total) by mouth daily. For major depressive disorder and anxiety. 10/14/13  Yes Mashburn, Marlane Hatcher, PA-C  furosemide (LASIX) 20 MG tablet Take 1 tablet (20 mg total) by mouth daily. 09/20/17   Nat Christen, MD    Family History Family History  Problem Relation Age of Onset  . Diabetes Mother     Social History Social History   Tobacco Use  . Smoking status: Never Smoker  . Smokeless tobacco: Never Used  Substance Use Topics  . Alcohol use: No  . Drug use: No     Allergies   Amoxicillin and Penicillins   Review of Systems Review of Systems  All other systems reviewed and are negative.    Physical Exam Updated Vital Signs BP (!) 146/101 (BP Location: Left Arm)   Pulse 87   Temp 98.1 F (36.7 C) (Oral)   Resp 18   Wt 68 kg (150 lb)   LMP 09/05/2017   SpO2 98%   BMI 28.34 kg/m   Physical Exam  Constitutional: She is oriented to person, place, and time. She appears well-developed and  well-nourished.  HENT:  Head: Normocephalic and atraumatic.  Eyes: Conjunctivae are normal.  Neck: Neck supple.  Cardiovascular: Normal rate and regular rhythm.  Pulmonary/Chest: Effort normal and breath sounds normal.  Abdominal: Soft. Bowel sounds are normal.  Genitourinary:  Genitourinary Comments: Pelvic exam: Normal external genitalia.  Minimal clear discharge from the cervical os.  Minimal suprapubic and left adnexal tenderness.  No obvious masses.  Musculoskeletal: Normal range of motion.  Neurological: She is alert and oriented to person, place, and time.  Skin:  No obvious extremity edema.  Hands are slightly edematous.  Psychiatric: She has a normal mood and affect. Her behavior is normal.  Nursing note and vitals reviewed.    ED Treatments / Results  Labs (all labs ordered are  listed, but only abnormal results are displayed) Labs Reviewed  COMPREHENSIVE METABOLIC PANEL - Abnormal; Notable for the following components:      Result Value   Potassium 3.2 (*)    Chloride 98 (*)    Glucose, Bld 109 (*)    All other components within normal limits  URINALYSIS, ROUTINE W REFLEX MICROSCOPIC - Abnormal; Notable for the following components:   APPearance CLOUDY (*)    All other components within normal limits  WET PREP, GENITAL  CBC WITH DIFFERENTIAL/PLATELET  PREGNANCY, URINE  GC/CHLAMYDIA PROBE AMP ([place]) NOT AT [facility]    EKG  EKG Interpretation None       Radiology Dg Chest 2 View  Result Date: 09/20/2017 CLINICAL DATA:  Generalized swelling EXAM: CHEST  2 VIEW COMPARISON:  03/28/2014 FINDINGS: Lungs are clear.  No pleural effusion or pneumothorax. The heart is normal in size. Visualized osseous structures are within normal limits. IMPRESSION: Normal chest radiographs. Electronically Signed   By: Julian Hy M.D.   On: 09/20/2017 17:24    Procedures Procedures (including critical care time)  Medications Ordered in ED Medications  furosemide (LASIX) tablet 20 mg (20 mg Oral Given 09/20/17 1946)     Initial Impression / Assessment and Plan / ED Course  I have reviewed the triage vital signs and the nursing notes.  Pertinent labs & imaging results that were available during my care of the patient were reviewed by me and considered in my medical decision making (see chart for details).    Patient is in no acute distress.  She may have some very minimal edema on physical exam, but is not very impressive.  Pelvic exam is noted.  Will prescribe Lasix 20 mg daily prn.  She will follow-up with her primary care doctor.   Final Clinical Impressions(s) / ED Diagnoses   Final diagnoses:  Swelling    ED Discharge Orders        Ordered    furosemide (LASIX) 20 MG tablet  Daily     09/20/17 1949       Nat Christen, MD 09/20/17 1955

## 2017-09-20 NOTE — ED Notes (Signed)
Pt returned from xray

## 2017-09-20 NOTE — ED Triage Notes (Signed)
Pt reports swelling in bilateral hand, feet and facial swelling for 2 weeks. Reports hands and feet have become painful. Pt also reports right sided neck and back pain.

## 2017-09-22 LAB — GC/CHLAMYDIA PROBE AMP (~~LOC~~) NOT AT ARMC
Chlamydia: NEGATIVE
NEISSERIA GONORRHEA: NEGATIVE

## 2018-12-15 ENCOUNTER — Emergency Department (HOSPITAL_COMMUNITY)
Admission: EM | Admit: 2018-12-15 | Discharge: 2018-12-15 | Disposition: A | Discharge status: Home / Self Care | Payer: Medicaid Other | Attending: Emergency Medicine | Admitting: Emergency Medicine

## 2018-12-15 ENCOUNTER — Encounter (HOSPITAL_COMMUNITY): Payer: Self-pay | Admitting: *Deleted

## 2018-12-15 ENCOUNTER — Other Ambulatory Visit: Payer: Self-pay

## 2018-12-15 DIAGNOSIS — Y998 Other external cause status: Secondary | ICD-10-CM | POA: Insufficient documentation

## 2018-12-15 DIAGNOSIS — Y929 Unspecified place or not applicable: Secondary | ICD-10-CM | POA: Insufficient documentation

## 2018-12-15 DIAGNOSIS — Y9389 Activity, other specified: Secondary | ICD-10-CM | POA: Diagnosis not present

## 2018-12-15 DIAGNOSIS — W268XXA Contact with other sharp object(s), not elsewhere classified, initial encounter: Secondary | ICD-10-CM | POA: Insufficient documentation

## 2018-12-15 DIAGNOSIS — S61011A Laceration without foreign body of right thumb without damage to nail, initial encounter: Secondary | ICD-10-CM | POA: Diagnosis not present

## 2018-12-15 DIAGNOSIS — Z23 Encounter for immunization: Secondary | ICD-10-CM | POA: Insufficient documentation

## 2018-12-15 DIAGNOSIS — S6991XA Unspecified injury of right wrist, hand and finger(s), initial encounter: Secondary | ICD-10-CM | POA: Diagnosis present

## 2018-12-15 MED ORDER — TETANUS-DIPHTH-ACELL PERTUSSIS 5-2.5-18.5 LF-MCG/0.5 IM SUSP
0.5000 mL | Freq: Once | INTRAMUSCULAR | Status: AC
  Administered 2018-12-15: 19:00:00 0.5 mL via INTRAMUSCULAR
  Filled 2018-12-15: qty 0.5

## 2018-12-15 NOTE — ED Triage Notes (Signed)
Pt cut her right thumb today when she had her hand in the trash looking for lost keys. Pt unsure what she cut her thumb on. Unknown last tetanus.

## 2018-12-15 NOTE — ED Provider Notes (Signed)
[facility] Emergency Department Provider Note MRN:  [id number]  Arrival date & time: 12/15/18     Chief Complaint   Finger Injury   History of Present Illness   [name] is a 38 y.o. year-old female with a history of bipolar disorder presenting to the ED with chief complaint of finger injury.  Patient explains that she was looking in the trash for a lost key when she cut her finger.  Does not know what object cut her finger.  Laceration to the base of the right thumb.  Denies any other injuries.  Unsure of last tetanus shot.  Denies head trauma or loss of consciousness.  Review of Systems  A problem-focused ROS was performed. Positive for finger laceration.  Patient denies numbness.  Patient's Health History    Past Medical History:  Diagnosis Date  . Anxiety   . Bipolar 1 disorder ([place])   . Depression   . Fibromyalgia   . Pinworms   . Pituitary tumor     Past Surgical History:  Procedure Laterality Date  . APPENDECTOMY    . CESAREAN SECTION     x2  . ECTOPIC PREGNANCY SURGERY    . pinched nerve rt arm.    . TUBAL LIGATION      Family History  Problem Relation Age of Onset  . Diabetes Mother     Social History   Socioeconomic History  . Marital status: Legally Separated    Spouse name: Not on file  . Number of children: Not on file  . Years of education: Not on file  . Highest education level: Not on file  Occupational History  . Not on file  Social Needs  . Financial resource strain: Not on file  . Food insecurity:    Worry: Not on file    Inability: Not on file  . Transportation needs:    Medical: Not on file    Non-medical: Not on file  Tobacco Use  . Smoking status: Never Smoker  . Smokeless tobacco: Never Used  Substance and Sexual Activity  . Alcohol use: No  . Drug use: No  . Sexual activity: Yes    Birth control/protection: None  Lifestyle  . Physical activity:    Days per week: Not on file    Minutes per  session: Not on file  . Stress: Not on file  Relationships  . Social connections:    Talks on phone: Not on file    Gets together: Not on file    Attends religious service: Not on file    Active member of club or organization: Not on file    Attends meetings of clubs or organizations: Not on file    Relationship status: Not on file  . Intimate partner violence:    Fear of current or ex partner: Not on file    Emotionally abused: Not on file    Physically abused: Not on file    Forced sexual activity: Not on file  Other Topics Concern  . Not on file  Social History Narrative  . Not on file     Physical Exam  Vital Signs and Nursing Notes reviewed Vitals:   12/15/18 1445  BP: (!) 132/92  Pulse: 82  Resp: 18  Temp: 98.3 F (36.8 C)    CONSTITUTIONAL: Well-appearing, NAD NEURO:  Alert and oriented x 3, no focal deficits EYES:  eyes equal and reactive ENT/NECK:  no LAD, no JVD CARDIO: Regular rate, well-perfused,  normal S1 and S2 PULM:  CTAB no wheezing or rhonchi GI/GU:  normal bowel sounds, non-distended, non-tender MSK/SPINE:  No gross deformities, no edema SKIN: Superficial laceration approximately 1.5 cm to the medial lateral aspect of the base of the right thumb, normal sensation distally, good cap refill distally, hemostatic PSYCH:  Appropriate speech and behavior  Diagnostic and Interventional Summary    Labs Reviewed - No data to display  No orders to display    Medications  Tdap (BOOSTRIX) injection 0.5 mL (has no administration in time range)     .Marland KitchenLaceration Repair Date/Time: 12/15/2018 6:40 PM Performed by: Maudie Flakes, MD Authorized by: Maudie Flakes, MD   Consent:    Consent obtained:  Verbal   Consent given by:  Patient   Risks discussed:  Infection, pain and poor cosmetic result   Alternatives discussed: Digital nerve block and suture repair. Anesthesia (see MAR for exact dosages):    Anesthesia method:  None Laceration details:     Location:  Finger   Finger location:  R thumb   Length (cm):  1.5   Depth (mm):  2 Repair type:    Repair type:  Simple Pre-procedure details:    Preparation:  Patient was prepped and draped in usual sterile fashion Exploration:    Hemostasis achieved with:  Direct pressure   Wound exploration: wound explored through full range of motion     Contaminated: no   Treatment:    Area cleansed with:  Soap and water   Amount of cleaning:  Standard Skin repair:    Repair method:  Tissue adhesive Approximation:    Approximation:  Close Post-procedure details:    Dressing:  Open (no dressing)   Patient tolerance of procedure:  Tolerated well, no immediate complications   Critical Care  ED Course and Medical Decision Making  I have reviewed the triage vital signs and the nursing notes.  Pertinent labs & imaging results that were available during my care of the patient were reviewed by me and considered in my medical decision making (see below for details).  Uncomplicated laceration repair as described above.  Patient was offered full suture repair with digital nerve block.  Patient is not a fan of needles, much preferred Dermabond closure.  Neurovascularly intact distally.  Wound is superficial and was fully evaluated, nothing to suggest foreign body.  After the discussed management above, the patient was determined to be safe for discharge.  The patient was in agreement with this plan and all questions regarding their care were answered.  ED return precautions were discussed and the patient will return to the ED with any significant worsening of condition.  Barth Kirks. Sedonia Small, [place] [email or web address]  Final Clinical Impressions(s) / ED Diagnoses     ICD-10-CM   1. Laceration of right thumb without foreign body without damage to nail, initial encounter S61.011A     ED Discharge Orders    None         Maudie Flakes, MD  12/15/18 [phone]

## 2018-12-15 NOTE — Discharge Instructions (Addendum)
You were evaluated in the Emergency Department and after careful evaluation, we did not find any emergent condition requiring admission or further testing in the hospital.  Your symptoms today seem to be due to a laceration to the finger, which we secured with medical adhesive glue.  This glue with wear away with time.  Avoid excessive submerging of the glue in water or excessive moving of the finger for the next few days.  Please return to the Emergency Department if you experience any worsening of your condition.  We encourage you to follow up with a primary care provider.  Thank you for allowing [place] to be a part of your care.

## 2019-06-21 ENCOUNTER — Encounter: Payer: Medicaid Other | Admitting: Family

## 2019-06-29 ENCOUNTER — Encounter: Payer: Self-pay | Admitting: Internal Medicine

## 2019-06-29 ENCOUNTER — Telehealth: Payer: Self-pay | Admitting: Pharmacy Technician

## 2019-06-29 ENCOUNTER — Other Ambulatory Visit: Payer: Self-pay

## 2019-06-29 ENCOUNTER — Ambulatory Visit (INDEPENDENT_AMBULATORY_CARE_PROVIDER_SITE_OTHER): Payer: Medicaid Other | Admitting: Internal Medicine

## 2019-06-29 DIAGNOSIS — B182 Chronic viral hepatitis C: Secondary | ICD-10-CM

## 2019-06-29 NOTE — Patient Instructions (Signed)
Date 06/29/19  Dear Ms [name], As discussed in the [facility], your hepatitis C therapy will include the following medications:          Mavyret (glecaprevir 100 mg/pibrentasvir 40 mg): Take 3 tablets by mouth once daily for 8 or 12 weeks ---------------------------------------------------------------- Your HCV Treatment Start Date: TBA   Your HCV genotype: unknown    Liver Fibrosis: TBD    ---------------------------------------------------------------- YOUR PHARMACY CONTACT:   [facility] [address] [place], [place] [address] Phone: [phone] Hours: Monday to Friday 7:30 am to 6:00 pm   Please always contact your pharmacy at least 3-4 business days before you run out of medications to ensure your next month's medication is ready or 1 week prior to running out if you receive it by mail.  Remember, each prescription is for 28 days. ---------------------------------------------------------------- GENERAL NOTES REGARDING YOUR HEPATITIS C MEDICATION:  Mavyret: - tablets are pink, oblong shape - take 3 tablets daily with food. - The tablets should be stored at room temperature.  - Acid reducing agents such as H2 blockers (ie. Pepcid (famotidine), Zantac (ranitidine), Tagamet (cimetidine), Axid (nizatidine) and proton pump inhibitors (ie. Prilosec (omeprazole), Protonix (pantoprazole), Nexium (esomeprazole), or Aciphex (rabeprazole)) can decrease effectiveness of Harvoni. Do not take until you have discussed with a health care provider.    -Antacids that contain magnesium and/or aluminum hydroxide (ie. Milk of Magensia, Rolaids, Gaviscon, Maalox, Mylanta, an dArthritis Pain Formula) can reduce absorption of Harvoni, so take them at least 4 hours before or after Harvoni.  -Calcium carbonate (calcium supplements or antacids such as Tums, Caltrate, Os-Cal) needs to be taken at least 4 hours hours before or after Harvoni.  -St. John's wort or any products that  contain St. John's wort like some herbal supplements  Please inform the office prior to starting any of these medications.  - The common side effects associated with Mavyret include:      1. Fatigue      2. Headache      3. Nausea      4. Diarrhea      5. Insomnia  Please note that this only lists the most common side effects and is NOT a comprehensive list of the potential side effects of these medications. For more information, please review the drug information sheets that come with your medication package from the pharmacy.  ---------------------------------------------------------------- GENERAL HELPFUL HINTS ON HCV THERAPY: 1. Stay well-hydrated. 2. Notify the [facility] Clinic of any changes in your other over-the-counter/herbal or prescription medications. 3. If you miss a dose of your medication, take the missed dose as soon as you remember. Return to your regular time/dose schedule the next day.  4.  Do not stop taking your medications without first talking with your healthcare provider. 5.  You may take Tylenol (acetaminophen), as long as the dose is less than 2000 mg (OR no more than 4 tablets of the Tylenol Extra Strengths 500mg  tablet) in 24 hours. 6.  You will see our pharmacist-specialist within the first 2 weeks of starting your medication to monitor for any possible side effects. 7.  You will have labs once during treatment, after soon after treatment completion and one final lab 6 months after treatment completion to verify the virus is out of your system.  Thayer Headings, [name] for Infectious Diseases [facility] [place], [place]  [phone]

## 2019-06-29 NOTE — Progress Notes (Signed)
[place] for Infectious Disease   CC: consideration for treatment for chronic hepatitis C  HPI:  +[name] is a 38 y.o. female who presents for initial evaluation and management of chronic hepatitis C.  Patient tested positive last year. Hepatitis C-associated risk factors present are: IV drug abuse (details: only a couple of times). Patient denies history of blood transfusion. Patient has had other studies performed. Results: hepatitis C RNA by PCR, result: positive. Patient has not had prior treatment for Hepatitis C. Patient does not have a past history of liver disease. Patient does not have a family history of liver disease. Patient does not  have associated signs or symptoms related to liver disease.  Labs reviewed and confirm chronic hepatitis C with a positive viral load last year.   Records reviewed from [place] and qual HCV last year positive.  AST 27, ALT 32 on 05/11/19.  Plt 335.        Patient does not have documented immunity to Hepatitis A. Patient does not have documented immunity to Hepatitis B.    Review of Systems:  Constitutional: negative for fatigue and malaise Gastrointestinal: negative for nausea and diarrhea Integument/breast: negative for rash Musculoskeletal: positive for back pain All other systems reviewed and are negative       Past Medical History:  Diagnosis Date  . Anxiety   . Bipolar 1 disorder ([place])   . Depression   . Fibromyalgia   . Pinworms   . Pituitary tumor     Prior to Admission medications   Medication Sig Start Date End Date Taking? Authorizing Provider  amphetamine-dextroamphetamine (ADDERALL) 10 MG tablet Take 10 mg by mouth 2 (two) times daily with a meal.   Yes [provider]  sertraline (ZOLOFT) 100 MG tablet Take 1 tablet (100 mg total) by mouth daily. For major depressive disorder and anxiety. 10/14/13  Yes Mashburn, Marlane Hatcher, PA-C  furosemide (LASIX) 20 MG tablet Take 1 tablet (20 mg total) by  mouth daily. Patient not taking: Reported on 06/29/2019 09/20/17   Nat Christen, MD  gabapentin (NEURONTIN) 300 MG capsule Take 1 capsule (300 mg total) by mouth 3 (three) times daily. For mood stabilization and withdrawal symptoms. Patient not taking: Reported on 06/29/2019 10/14/13   Nena Polio T, PA-C  ibuprofen (ADVIL,MOTRIN) 200 MG tablet Take 400 mg by mouth every 6 (six) hours as needed for moderate pain.    [provider]  meloxicam (MOBIC) 7.5 MG tablet Take 7.5 mg by mouth daily.    [provider]    Allergies  Allergen Reactions  . Amoxicillin Anaphylaxis  . Naloxone Itching and Nausea And Vomiting    Other reaction(s): Agitation headache  . Penicillins Anaphylaxis    .Has patient had a PCN reaction causing immediate rash, facial/tongue/throat swelling, SOB or lightheadedness with hypotension: Yes Has patient had a PCN reaction causing severe rash involving mucus membranes or skin necrosis: No Has patient had a PCN reaction that required hospitalization: Yes Has patient had a PCN reaction occurring within the last 10 years: No If all of the above answers are "NO", then may proceed with Cephalosporin use.     Social History   Tobacco Use  . Smoking status: Never Smoker  . Smokeless tobacco: Never Used  Substance Use Topics  . Alcohol use: No  . Drug use: No    Family History  Problem Relation Age of Onset  . Diabetes Mother       Objective:  Constitutional: in no apparent distress,  Vitals:   06/29/19 0918  BP: 128/83  Pulse: (!) 102  Temp: 98.2 F (36.8 C)   Eyes: anicteric Cardiovascular: Cor RRR Respiratory: clear Gastrointestinal: soft Musculoskeletal: no pedal edema noted Skin: negatives: no rash; no porphyria cutanea tarda Lymphatic: no cervical lymphadenopathy   Laboratory Genotype: No results found for: HCVGENOTYPE HCV viral load: No results found for: HCVQUANT Lab Results  Component Value Date   WBC 6.8 09/20/2017    HGB 14.3 09/20/2017   HCT 43.3 09/20/2017   MCV 90.8 09/20/2017   PLT 297 09/20/2017    Lab Results  Component Value Date   CREATININE 0.61 09/20/2017   BUN 12 09/20/2017   NA 135 09/20/2017   K 3.2 (L) 09/20/2017   CL 98 (L) 09/20/2017   CO2 29 09/20/2017    Lab Results  Component Value Date   ALT 44 09/20/2017   AST 32 09/20/2017   ALKPHOS 59 09/20/2017     Labs and history reviewed and show CHILD-PUGH A  5-6 points: Child class A 7-9 points: Child class B 10-15 points: Child class C  Lab Results  Component Value Date   BILITOT 0.5 09/20/2017   ALBUMIN 4.1 09/20/2017     Assessment: New Patient with Chronic Hepatitis C genotype unknown, untreated.  I discussed with the patient the lab findings that confirm chronic hepatitis C as well as the natural history and progression of disease including about 30% of people who develop cirrhosis of the liver if left untreated and once cirrhosis is established there is a 2-7% risk per year of liver cancer and liver failure.  I discussed the importance of treatment and benefits in reducing the risk, even if significant liver fibrosis exists.   Plan: 1) Patient counseled extensively on limiting acetaminophen to no more than 2 grams daily, avoidance of alcohol. 2) Transmission discussed with patient including sexual transmission, sharing razors and toothbrush.   3) Will need referral to gastroenterology if concern for cirrhosis 4) Will need referral for substance abuse counseling: No.; Further work up to include urine drug screen  No. 5) Will prescribe Mavyret once labs and ultrasound done 6) Hepatitis A and B titers 7) Pneumovax vaccine - deferred for now since we are out 9) Further work up to include liver staging with elastography 10) will follow up after starting medication

## 2019-06-29 NOTE — Telephone Encounter (Signed)
[facility] Patient Advocate Encounter   Received notification from [place] that prior authorization for Hepatitis C medication is required.   Needed readiness form has been signed.  We will process once medication is written.  [name]. [name] [place] Patient [facility] for Infectious Disease Phone: [phone] Fax:  [phone]

## 2019-06-29 NOTE — Telephone Encounter (Signed)
[facility] Patient Teacher, English as a foreign language completed.    The patient is insured through [employer] and has a $3 copay.  We will continue to follow to see if copay assistance is needed.  [name]. [name] [name] Patient [facility] for Infectious Disease Phone: [phone] Fax:  [phone]

## 2019-07-05 ENCOUNTER — Other Ambulatory Visit: Payer: Self-pay | Admitting: Internal Medicine

## 2019-07-05 ENCOUNTER — Telehealth: Payer: Self-pay

## 2019-07-05 ENCOUNTER — Ambulatory Visit (HOSPITAL_COMMUNITY)
Admission: RE | Admit: 2019-07-05 | Discharge: 2019-07-05 | Disposition: A | Discharge status: Home / Self Care | Payer: Medicaid Other | Source: Ambulatory Visit | Attending: Internal Medicine | Admitting: Internal Medicine

## 2019-07-05 ENCOUNTER — Other Ambulatory Visit: Payer: Self-pay

## 2019-07-05 DIAGNOSIS — B182 Chronic viral hepatitis C: Secondary | ICD-10-CM | POA: Insufficient documentation

## 2019-07-05 LAB — LIVER FIBROSIS, FIBROTEST-ACTITEST
ALT: 24 U/L (ref 6–29)
Alpha-2-Macroglobulin: 197 mg/dL (ref 106–279)
Apolipoprotein A1: 172 mg/dL (ref 101–198)
Bilirubin: 0.4 mg/dL (ref 0.2–1.2)
Fibrosis Score: 0.08
GGT: 25 U/L (ref 3–50)
Haptoglobin: 131 mg/dL (ref 43–212)
Necroinflammat ACT Score: 0.08
Reference ID: 3192381

## 2019-07-05 LAB — HEPATITIS A ANTIBODY, TOTAL: Hepatitis A AB,Total: REACTIVE — AB

## 2019-07-05 LAB — PROTIME-INR
INR: 1
Prothrombin Time: 10.5 s (ref 9.0–11.5)

## 2019-07-05 LAB — HEPATITIS B SURFACE ANTIGEN: Hepatitis B Surface Ag: NONREACTIVE

## 2019-07-05 LAB — HEPATITIS C RNA QUANTITATIVE
HCV Quantitative Log: 6.05 Log IU/mL — ABNORMAL HIGH
HCV RNA, PCR, QN: 1110000 IU/mL — ABNORMAL HIGH

## 2019-07-05 LAB — HEPATITIS B SURFACE ANTIBODY,QUALITATIVE: Hep B S Ab: BORDERLINE — AB

## 2019-07-05 LAB — HEPATITIS C GENOTYPE

## 2019-07-05 LAB — HEPATITIS B CORE ANTIBODY, TOTAL: Hep B Core Total Ab: NONREACTIVE

## 2019-07-05 NOTE — Telephone Encounter (Signed)
Received fax request for patient office notes from appointment with Dr. Linus Salmons. Provided clinic notes from 06/29/2019 appointment.   Angela Mielke Lorita Officer, RN

## 2019-07-06 ENCOUNTER — Other Ambulatory Visit: Payer: Self-pay | Admitting: Internal Medicine

## 2019-07-06 MED ORDER — MAVYRET 100-40 MG PO TABS: 3.0000 | ORAL_TABLET | Freq: Every day | ORAL | 1 refills | Status: DC

## 2019-07-07 ENCOUNTER — Telehealth: Payer: Self-pay | Admitting: Family

## 2019-07-07 NOTE — Telephone Encounter (Signed)
Error

## 2019-07-09 ENCOUNTER — Other Ambulatory Visit: Payer: Self-pay | Admitting: Pharmacist

## 2019-07-09 DIAGNOSIS — B182 Chronic viral hepatitis C: Secondary | ICD-10-CM

## 2019-07-09 MED ORDER — MAVYRET 100-40 MG PO TABS: 3.0000 | ORAL_TABLET | Freq: Every day | ORAL | 1 refills | Status: AC

## 2019-07-14 ENCOUNTER — Telehealth: Payer: Self-pay | Admitting: Pharmacy Technician

## 2019-07-14 NOTE — Telephone Encounter (Signed)
[facility] Patient Advocate Encounter   Received notification from [place] that prior authorization for [place] is required.   PA submitted on 07/14/2019 Key [id number] W Status is pending    [facility] will continue to follow.  Bartholomew Crews, CPhT Specialty Pharmacy Patient [facility] for Infectious Disease Phone: [phone] Fax: [phone] 07/14/2019 3:45 PM

## 2019-07-19 NOTE — Telephone Encounter (Addendum)
[facility] Patient Advocate Encounter  Prior Authorization for Mavyret has been approved.    PA# [id number] Effective dates: 07/14/2019 through 09/12/2019  Patients co-pay is $3.00.   [facility] will continue to follow. Spoke to patient and they will have their medication shipped on 12/29 to their home address. All questions were answered and appointment made.   [name], CPhT Specialty Pharmacy Patient [facility] for Infectious Disease Phone: [phone] Fax: [phone] 07/19/2019 10:28 AM

## 2019-07-20 MED FILL — MAVYRET 100-40 MG TABS: 100-40 | 28 days supply | Qty: 84 | Fill #0

## 2019-07-21 ENCOUNTER — Telehealth: Payer: Self-pay | Admitting: Pharmacist

## 2019-07-22 NOTE — Telephone Encounter (Signed)
Thank you Tyler.

## 2019-08-03 ENCOUNTER — Other Ambulatory Visit: Payer: Self-pay

## 2019-08-03 ENCOUNTER — Ambulatory Visit
Admission: EM | Admit: 2019-08-03 | Discharge: 2019-08-03 | Disposition: A | Discharge status: Home / Self Care | Payer: Medicaid Other

## 2019-08-03 DIAGNOSIS — K029 Dental caries, unspecified: Secondary | ICD-10-CM

## 2019-08-03 MED ORDER — ONDANSETRON HCL 4 MG PO TABS: 4.0000 mg | ORAL_TABLET | Freq: Three times a day (TID) | ORAL | 0 refills | Status: AC | PRN

## 2019-08-03 MED ORDER — LIDOCAINE VISCOUS HCL 2 % MT SOLN: 15.0000 mL | OROMUCOSAL | 0 refills | Status: AC | PRN

## 2019-08-03 MED ORDER — CLINDAMYCIN HCL 300 MG PO CAPS: 300.0000 mg | ORAL_CAPSULE | Freq: Three times a day (TID) | ORAL | 0 refills | Status: DC

## 2019-08-03 NOTE — Discharge Instructions (Addendum)
Viscous lidocaine prescribed.  Use as directed for pain relief Zofran prescribed for nausea Use as directed for pain relief Clindamycin prescribed.  Take as directed and to completion Recommend a soft diet  Maintain proper dental hygiene Follow up with dentist as soon as possible for further evaluation and treatment

## 2019-08-03 NOTE — ED Triage Notes (Signed)
Pt presents with c/o dental pain in both upper and lower left jaw

## 2019-08-03 NOTE — ED Provider Notes (Signed)
[facility]    CSN: [id number] Arrival date & time: 08/03/19  1621      History   Chief Complaint Chief Complaint  Patient presents with  . Dental Pain    HPI [name] is a 39 y.o. female.   [name] 39 years old female  presents to the urgent care with dental pain for the past 7 days.  Denies a precipitating event or trauma.  Localizes pain to his left upper and lower gum.  Has tried OTC analgesics without relief.  Worse with chewing.  Does not see a dentist regularly.  Denies similar symptoms in the past.  Denies fever, chills, dysphagia, odynophagia, oral or neck swelling, nausea, vomiting, chest pain, SOB.    The history is provided by the patient. No language interpreter was used.  Dental Pain   Past Medical History:  Diagnosis Date  . Anxiety   . Bipolar 1 disorder ([place])   . Depression   . Fibromyalgia   . Pinworms   . Pituitary tumor     Patient Active Problem List   Diagnosis Date Noted  . Chronic hepatitis C without hepatic coma ([place]) 06/29/2019  . Polysubstance (including opioids) dependence with physiological dependence ([place]) 10/05/2013  . Substance induced mood disorder ([place]) 10/05/2013  . MDD (major depressive disorder) 06/13/2012    Past Surgical History:  Procedure Laterality Date  . APPENDECTOMY    . CESAREAN SECTION     x2  . ECTOPIC PREGNANCY SURGERY    . pinched nerve rt arm.    . TUBAL LIGATION      OB History    Gravida  5   Para  2   Term  2   Preterm      AB  3   Living  2     SAB  2   TAB      Ectopic  1   Multiple      Live Births               Home Medications    Prior to Admission medications   Medication Sig Start Date End Date Taking? Authorizing Provider  amphetamine-dextroamphetamine (ADDERALL) 10 MG tablet Take 10 mg by mouth 2 (two) times daily with a meal.    [provider]  buprenorphine (SUBUTEX) 8 MG SUBL SL tablet Place 8 mg under the tongue 3 (three)  times daily. 07/17/19   [provider]  clindamycin (CLEOCIN) 300 MG capsule Take 1 capsule (300 mg total) by mouth 3 (three) times daily for 7 days. 08/03/19 08/10/19  Tarry Blayney, Darrelyn Hillock, FNP  furosemide (LASIX) 20 MG tablet Take 1 tablet (20 mg total) by mouth daily. Patient not taking: Reported on 06/29/2019 09/20/17   Nat Christen, MD  gabapentin (NEURONTIN) 300 MG capsule Take 1 capsule (300 mg total) by mouth 3 (three) times daily. For mood stabilization and withdrawal symptoms. Patient not taking: Reported on 06/29/2019 10/14/13   Ruben Im, PA-C  Glecaprevir-Pibrentasvir (MAVYRET) 100-40 MG TABS Take 3 tablets by mouth daily with breakfast. 07/09/19   Kuppelweiser, Cassie L, RPH-CPP  ibuprofen (ADVIL,MOTRIN) 200 MG tablet Take 400 mg by mouth every 6 (six) hours as needed for moderate pain.    [provider]  lidocaine (XYLOCAINE) 2 % solution Use as directed 15 mLs in the mouth or throat as needed for mouth pain. 08/03/19   Vicky Mccanless, Darrelyn Hillock, FNP  meloxicam (MOBIC) 7.5 MG tablet Take 7.5 mg by mouth daily.  [provider]  ondansetron (ZOFRAN) 4 MG tablet Take 1 tablet (4 mg total) by mouth every 8 (eight) hours as needed for nausea or vomiting. 08/03/19   Zyona Pettaway, Darrelyn Hillock, FNP  sertraline (ZOLOFT) 100 MG tablet Take 1 tablet (100 mg total) by mouth daily. For major depressive disorder and anxiety. 10/14/13   Ruben Im, PA-C    Family History Family History  Problem Relation Age of Onset  . Diabetes Mother     Social History Social History   Tobacco Use  . Smoking status: Never Smoker  . Smokeless tobacco: Never Used  Substance Use Topics  . Alcohol use: No  . Drug use: No     Allergies   Amoxicillin, Naloxone, and Penicillins   Review of Systems Review of Systems  Constitutional: Negative.   HENT: Positive for dental problem.   Respiratory: Negative.   Cardiovascular: Negative.   All other systems reviewed and are negative.     Physical Exam Triage Vital Signs ED Triage Vitals  Enc Vitals Group     BP      Pulse      Resp      Temp      Temp src      SpO2      Weight      Height      Head Circumference      Peak Flow      Pain Score      Pain Loc      Pain Edu?      Excl. in [place]?    No data found.  Updated Vital Signs BP 131/89   Pulse 87   Temp 98.2 F (36.8 C)   Resp 20   LMP 07/20/2019   SpO2 99%   Visual Acuity Right Eye Distance:   Left Eye Distance:   Bilateral Distance:    Right Eye Near:   Left Eye Near:    Bilateral Near:     Physical Exam Vitals and nursing note reviewed.  Constitutional:      General: She is not in acute distress.    Appearance: Normal appearance. She is normal weight. She is not ill-appearing or toxic-appearing.  HENT:     Head: Normocephalic.     Right Ear: Tympanic membrane, ear canal and external ear normal. There is no impacted cerumen.     Left Ear: Tympanic membrane, ear canal and external ear normal. There is no impacted cerumen.     Nose: Nose normal.     Mouth/Throat:     Lips: Pink.     Mouth: Mucous membranes are moist.     Dentition: Dental tenderness and dental caries present.     Pharynx: Oropharynx is clear. No oropharyngeal exudate or posterior oropharyngeal erythema.   Cardiovascular:     Rate and Rhythm: Normal rate and regular rhythm.     Pulses: Normal pulses.     Heart sounds: Normal heart sounds.  Pulmonary:     Effort: Pulmonary effort is normal. No respiratory distress.     Breath sounds: Normal breath sounds. No wheezing.  Chest:     Chest wall: No tenderness.  Neurological:     Mental Status: She is alert.      UC Treatments / Results  Labs (all labs ordered are listed, but only abnormal results are displayed) Labs Reviewed - No data to display  EKG   Radiology No results found.  Procedures Procedures (including critical care time)  Medications  Ordered in UC Medications - No data to display   Initial Impression / Assessment and Plan / UC Course  I have reviewed the triage vital signs and the nursing notes.  Pertinent labs & imaging results that were available during my care of the patient were reviewed by me and considered in my medical decision making (see chart for details).   Patient stable for discharge.  Patient was advised to take medication as prescribed and to completion.  Dental resource was given and patient was advised to follow-up with a dentist.  Patient verbalized understanding plan of care.  Final Clinical Impressions(s) / UC Diagnoses   Final diagnoses:  Dental caries     Discharge Instructions     Viscous lidocaine prescribed.  Use as directed for pain relief Zofran prescribed for nausea Use as directed for pain relief Clindamycin prescribed.  Take as directed and to completion Recommend a soft diet  Maintain proper dental hygiene Follow up with dentist as soon as possible for further evaluation and treatment      ED Prescriptions    Medication Sig Dispense Auth. Provider   lidocaine (XYLOCAINE) 2 % solution Use as directed 15 mLs in the mouth or throat as needed for mouth pain. 100 mL Shadasia Oldfield S, FNP   ondansetron (ZOFRAN) 4 MG tablet Take 1 tablet (4 mg total) by mouth every 8 (eight) hours as needed for nausea or vomiting. 20 tablet Alford Gamero, Darrelyn Hillock, FNP   clindamycin (CLEOCIN) 300 MG capsule Take 1 capsule (300 mg total) by mouth 3 (three) times daily for 7 days. 21 capsule Sadik Piascik, Darrelyn Hillock, FNP     PDMP not reviewed this encounter.   Emerson Monte, [place] 08/03/19 1704

## 2019-08-09 ENCOUNTER — Telehealth: Payer: Self-pay

## 2019-08-09 MED ORDER — CLINDAMYCIN HCL 300 MG PO CAPS: 300.0000 mg | ORAL_CAPSULE | Freq: Three times a day (TID) | ORAL | 0 refills | Status: AC

## 2019-08-09 NOTE — Telephone Encounter (Signed)
Pt called UC stating her gum swelling and pain has gotten better but is still present. Pt is prescribed 3 more days of abx. Pt is informed if she has any worsening symptoms, difficulty breathing, or difficulty swallowing to go to the ED. Pt showed understanding.

## 2019-08-17 MED FILL — MAVYRET 100-40 MG TABS: 100-40 | 28 days supply | Qty: 84 | Fill #1

## 2019-08-18 ENCOUNTER — Other Ambulatory Visit: Payer: Self-pay | Admitting: Pharmacist

## 2019-08-18 DIAGNOSIS — B182 Chronic viral hepatitis C: Secondary | ICD-10-CM

## 2019-08-20 ENCOUNTER — Ambulatory Visit: Payer: Medicaid Other | Admitting: Pharmacist

## 2019-08-20 ENCOUNTER — Other Ambulatory Visit: Payer: Self-pay

## 2019-08-20 ENCOUNTER — Ambulatory Visit (INDEPENDENT_AMBULATORY_CARE_PROVIDER_SITE_OTHER): Payer: Medicaid Other | Admitting: Pharmacist

## 2019-08-20 DIAGNOSIS — B182 Chronic viral hepatitis C: Secondary | ICD-10-CM

## 2019-08-24 ENCOUNTER — Telehealth: Payer: Self-pay | Admitting: Pharmacist

## 2019-08-24 LAB — COMPREHENSIVE METABOLIC PANEL
AG Ratio: 1.4 (calc) (ref 1.0–2.5)
ALT: 9 U/L (ref 6–29)
AST: 10 U/L (ref 10–30)
Albumin: 4.3 g/dL (ref 3.6–5.1)
Alkaline phosphatase (APISO): 74 U/L (ref 31–125)
BUN: 16 mg/dL (ref 7–25)
CO2: 32 mmol/L (ref 20–32)
Calcium: 9.6 mg/dL (ref 8.6–10.2)
Chloride: 101 mmol/L (ref 98–110)
Creat: 0.54 mg/dL (ref 0.50–1.10)
Globulin: 3 g/dL (calc) (ref 1.9–3.7)
Glucose, Bld: 105 mg/dL — ABNORMAL HIGH (ref 65–99)
Potassium: 3.8 mmol/L (ref 3.5–5.3)
Sodium: 140 mmol/L (ref 135–146)
Total Bilirubin: 0.5 mg/dL (ref 0.2–1.2)
Total Protein: 7.3 g/dL (ref 6.1–8.1)

## 2019-08-24 LAB — HEPATITIS C RNA QUANTITATIVE
HCV Quantitative Log: <1.18 Log IU/mL
HCV RNA, PCR, QN: <15 IU/mL

## 2019-08-24 NOTE — Progress Notes (Signed)
Patient came in to get Hep C labs. She is doing well on Mavyret and having no issues or missed doses.

## 2019-08-24 NOTE — Telephone Encounter (Signed)
Called patient to discuss Hep C RNA lab results from last week. She is undetectable indicating that the medication is working and to continue taking her full treatment course of [place] for best chances of cure. No answer, no VM left. She will need a f/u scheduled with me for the beginning of March if she calls back.

## 2019-10-05 ENCOUNTER — Ambulatory Visit: Payer: Medicaid Other | Admitting: Pharmacist

## 2020-06-08 ENCOUNTER — Other Ambulatory Visit: Payer: Self-pay

## 2020-06-08 ENCOUNTER — Ambulatory Visit
Admission: EM | Admit: 2020-06-08 | Discharge: 2020-06-08 | Disposition: A | Discharge status: Home / Self Care | Payer: Medicaid Other | Attending: Emergency Medicine | Admitting: Emergency Medicine

## 2020-06-08 DIAGNOSIS — L237 Allergic contact dermatitis due to plants, except food: Secondary | ICD-10-CM

## 2020-06-08 DIAGNOSIS — L299 Pruritus, unspecified: Secondary | ICD-10-CM

## 2020-06-08 MED ORDER — PREDNISONE 10 MG (21) PO TBPK: ORAL_TABLET | Freq: Every day | ORAL | 0 refills | Status: AC

## 2020-06-08 MED ORDER — HYDROXYZINE HCL 25 MG PO TABS: 25.0000 mg | ORAL_TABLET | Freq: Four times a day (QID) | ORAL | 0 refills | Status: AC

## 2020-06-08 MED ORDER — DEXAMETHASONE SODIUM PHOSPHATE 10 MG/ML IJ SOLN
10.0000 mg | Freq: Once | INTRAMUSCULAR | Status: AC
  Administered 2020-06-08: 10 mg via INTRAMUSCULAR

## 2020-06-08 NOTE — ED Provider Notes (Signed)
[facility]   [id number] 06/08/20 Arrival Time: 2774  CC: Poison ivy rash  SUBJECTIVE:  [name] is a 39 y.o. female who presents with a poison ivy rash x 3-4 days ago.  Husband came into contact with poison ivy and has spread to her.  Localizes the rash to abdomen and underneath breast, as well as LT hand.  Describes it as painful/ itchy, red and spreading.  Has tried OTC medications without relief.  Symptoms are made worse with scratching.  Denies similar symptoms in the past.  Also mentions swelling in hands and feet, and mild throat irritation.   Denies fever, chills, nausea, vomiting, swelling, discharge, oral lesions, SOB, chest pain.  ROS: As per HPI.  All other pertinent ROS negative.     Past Medical History:  Diagnosis Date  . Anxiety   . Bipolar 1 disorder ([place])   . Depression   . Fibromyalgia   . Pinworms   . Pituitary tumor    Past Surgical History:  Procedure Laterality Date  . APPENDECTOMY    . CESAREAN SECTION     x2  . ECTOPIC PREGNANCY SURGERY    . pinched nerve rt arm.    . TUBAL LIGATION     Allergies  Allergen Reactions  . Amoxicillin Anaphylaxis  . Naloxone Itching and Nausea And Vomiting    Other reaction(s): Agitation headache  . Penicillins Anaphylaxis    .Has patient had a PCN reaction causing immediate rash, facial/tongue/throat swelling, SOB or lightheadedness with hypotension: Yes Has patient had a PCN reaction causing severe rash involving mucus membranes or skin necrosis: No Has patient had a PCN reaction that required hospitalization: Yes Has patient had a PCN reaction occurring within the last 10 years: No If all of the above answers are "NO", then may proceed with Cephalosporin use.    No current facility-administered medications on file prior to encounter.   Current Outpatient Medications on File Prior to Encounter  Medication Sig Dispense Refill  . amphetamine-dextroamphetamine (ADDERALL) 10 MG tablet Take 10 mg  by mouth 2 (two) times daily with a meal.    . buprenorphine (SUBUTEX) 8 MG SUBL SL tablet Place 8 mg under the tongue 3 (three) times daily.    . Glecaprevir-Pibrentasvir (MAVYRET) 100-40 MG TABS Take 3 tablets by mouth daily with breakfast. 84 tablet 1  . ibuprofen (ADVIL,MOTRIN) 200 MG tablet Take 400 mg by mouth every 6 (six) hours as needed for moderate pain.    Marland Kitchen lidocaine (XYLOCAINE) 2 % solution Use as directed 15 mLs in the mouth or throat as needed for mouth pain. 100 mL 0  . meloxicam (MOBIC) 7.5 MG tablet Take 7.5 mg by mouth daily.    . ondansetron (ZOFRAN) 4 MG tablet Take 1 tablet (4 mg total) by mouth every 8 (eight) hours as needed for nausea or vomiting. 20 tablet 0  . sertraline (ZOLOFT) 100 MG tablet Take 1 tablet (100 mg total) by mouth daily. For major depressive disorder and anxiety. 30 tablet 0  . [DISCONTINUED] furosemide (LASIX) 20 MG tablet Take 1 tablet (20 mg total) by mouth daily. (Patient not taking: Reported on 06/29/2019) 10 tablet 0   Social History   Socioeconomic History  . Marital status: Legally Separated    Spouse name: Not on file  . Number of children: Not on file  . Years of education: Not on file  . Highest education level: Not on file  Occupational History  . Not on file  Tobacco Use  . Smoking status: Never Smoker  . Smokeless tobacco: Never Used  Substance and Sexual Activity  . Alcohol use: No  . Drug use: No  . Sexual activity: Yes    Birth control/protection: None  Other Topics Concern  . Not on file  Social History Narrative  . Not on file   Social Determinants of Health   Financial Resource Strain:   . Difficulty of Paying Living Expenses: Not on file  Food Insecurity:   . Worried About Charity fundraiser in the Last Year: Not on file  . Ran Out of Food in the Last Year: Not on file  Transportation Needs:   . Lack of Transportation (Medical): Not on file  . Lack of Transportation (Non-Medical): Not on file  Physical  Activity:   . Days of Exercise per Week: Not on file  . Minutes of Exercise per Session: Not on file  Stress:   . Feeling of Stress : Not on file  Social Connections:   . Frequency of Communication with Friends and Family: Not on file  . Frequency of Social Gatherings with Friends and Family: Not on file  . Attends Religious Services: Not on file  . Active Member of Clubs or Organizations: Not on file  . Attends Archivist Meetings: Not on file  . Marital Status: Not on file  Intimate Partner Violence:   . Fear of Current or Ex-Partner: Not on file  . Emotionally Abused: Not on file  . Physically Abused: Not on file  . Sexually Abused: Not on file   Family History  Problem Relation Age of Onset  . Diabetes Mother     OBJECTIVE: Vitals:   06/08/20 1252  BP: (!) 162/87  Pulse: 93  Resp: 16  Temp: 98.5 F (36.9 C)  TempSrc: Oral  SpO2: 98%    General appearance: alert; no distress Head: NCAT Lungs: normal respiratory effort Extremities: no edema Skin: warm and dry; macular/ papular erythema to RUQ of abdomen and underneath breast, no obvious vesicles, NTTP, no obvious drainage or bleeding Psychological: alert and cooperative; normal mood and affect  ASSESSMENT & PLAN:  1. Poison ivy dermatitis   2. Itching     Meds ordered this encounter  Medications  . predniSONE (STERAPRED UNI-PAK 21 TAB) 10 MG (21) TBPK tablet    Sig: Take by mouth daily. Take 6 tabs by mouth daily  for 2 days, then 5 tabs for 2 days, then 4 tabs for 2 days, then 3 tabs for 2 days, 2 tabs for 2 days, then 1 tab by mouth daily for 2 days    Dispense:  42 tablet    Refill:  0    Order Specific Question:   Supervising Provider    Answer:   Raylene Everts [[id number]]  . hydrOXYzine (ATARAX/VISTARIL) 25 MG tablet    Sig: Take 1 tablet (25 mg total) by mouth every 6 (six) hours.    Dispense:  12 tablet    Refill:  0    Order Specific Question:   Supervising Provider    Answer:    Raylene Everts [[id number]]  . dexamethasone (DECADRON) injection 10 mg    Wash with warm water and mild soap Steroid shot given in office Prednisone prescribed.  Take as directed and to completion Use OTC zyrtec, allegra, or claritin during the day.  Benadryl at night. You may also use OTC hydrocortisone cream and/or calamine lotion to help alleviate itching Hydroxyzine  as needed for itching.  THIS MEDICATION MAY MAKE YOU DROWSY do not take prior to driving or operating heavy machinery Follow up with PCP if symptoms persists  Return or go to the ED if you have any new or worsening symptoms such as fever, chills, nausea, vomiting, difficulty breathing, throat swelling, tongue swelling, numbness/ tingling in mouth, worsening symptoms despite treatment, etc...    Reviewed expectations re: course of current medical issues. Questions answered. Outlined signs and symptoms indicating need for more acute intervention. Patient verbalized understanding. After Visit Summary given.   Lestine Box, PA-C 06/08/20 1333

## 2020-06-08 NOTE — Discharge Instructions (Signed)
Wash with warm water and mild soap Steroid shot given in office Prednisone prescribed.  Take as directed and to completion Use OTC zyrtec, allegra, or claritin during the day.  Benadryl at night. You may also use OTC hydrocortisone cream and/or calamine lotion to help alleviate itching Hydroxyzine as needed for itching.  THIS MEDICATION MAY MAKE YOU DROWSY do not take prior to driving or operating heavy machinery Follow up with PCP if symptoms persists  Return or go to the ED if you have any new or worsening symptoms such as fever, chills, nausea, vomiting, difficulty breathing, throat swelling, tongue swelling, numbness/ tingling in mouth, worsening symptoms despite treatment, etc..Marland Kitchen

## 2020-06-08 NOTE — ED Triage Notes (Signed)
Pt sadi husband cuts trees and he had come in contact with poison ivy and now she has a rash all over her abdomen , breast and arms and is itching really bad.

## 2020-09-10 ENCOUNTER — Other Ambulatory Visit: Payer: Self-pay

## 2020-09-10 ENCOUNTER — Encounter (HOSPITAL_COMMUNITY): Payer: Self-pay | Admitting: *Deleted

## 2020-09-10 ENCOUNTER — Emergency Department (HOSPITAL_COMMUNITY)
Admission: EM | Admit: 2020-09-10 | Discharge: 2020-09-10 | Disposition: A | Discharge status: Home / Self Care | Payer: Medicaid Other | Attending: Emergency Medicine | Admitting: Emergency Medicine

## 2020-09-10 DIAGNOSIS — W228XXA Striking against or struck by other objects, initial encounter: Secondary | ICD-10-CM | POA: Diagnosis not present

## 2020-09-10 DIAGNOSIS — S0502XA Injury of conjunctiva and corneal abrasion without foreign body, left eye, initial encounter: Secondary | ICD-10-CM | POA: Insufficient documentation

## 2020-09-10 DIAGNOSIS — S0592XA Unspecified injury of left eye and orbit, initial encounter: Secondary | ICD-10-CM | POA: Diagnosis present

## 2020-09-10 MED ORDER — FLUORESCEIN SODIUM 1 MG OP STRP
1.0000 | ORAL_STRIP | Freq: Once | OPHTHALMIC | Status: AC
  Administered 2020-09-10: 1 via OPHTHALMIC
  Filled 2020-09-10: qty 1

## 2020-09-10 MED ORDER — TOBRAMYCIN 0.3 % OP SOLN
1.0000 [drp] | OPHTHALMIC | Status: DC
  Administered 2020-09-10: 1 [drp] via OPHTHALMIC
  Filled 2020-09-10: qty 5

## 2020-09-10 MED ORDER — KETOROLAC TROMETHAMINE 0.5 % OP SOLN
1.0000 [drp] | Freq: Four times a day (QID) | OPHTHALMIC | Status: DC
  Administered 2020-09-10: 1 [drp] via OPHTHALMIC
  Filled 2020-09-10: qty 5

## 2020-09-10 MED ORDER — TETRACAINE HCL 0.5 % OP SOLN
2.0000 [drp] | Freq: Once | OPHTHALMIC | Status: AC
  Administered 2020-09-10: 2 [drp] via OPHTHALMIC
  Filled 2020-09-10: qty 4

## 2020-09-10 NOTE — ED Provider Notes (Signed)
[facility] EMERGENCY DEPARTMENT Provider Note   CSN: [id number] Arrival date & time: 09/10/20  1616     History Chief Complaint  Patient presents with  . Foreign Body in [place] to scalp     [name] is a 40 y.o. female.  HPI      [name] is a 40 y.o. female who presents to the Emergency Department complaining of left eye pain and blurred vision.  States that she was struck by a piece of mulch 2 days ago while her husband was using a [employer], [employer].  Was not wearing eye protection at the time.  She has flushed her eye and used over-the-counter eyedrops without relief.  Pain is associated with blinking and with closing her eyes.  She does admit to wiping her eyes excessively since the injury occurred.  She does not wear corrective lenses.  Also notes excessive tearing of the eye.  No fever or chills, neck pain or facial pain.  She is also concerned about a "knot" to her left scalp that has been present for several days and present before the eye injury occurred.  Admits to wearing her hair pulled tightly in a ponytail.  No ear pain, headache or dizziness.    Past Medical History:  Diagnosis Date  . Anxiety   . Bipolar 1 disorder ([place])   . Depression   . Fibromyalgia   . Pinworms   . Pituitary tumor     Patient Active Problem List   Diagnosis Date Noted  . Chronic hepatitis C without hepatic coma ([place]) 06/29/2019  . Polysubstance (including opioids) dependence with physiological dependence ([place]) 10/05/2013  . Substance induced mood disorder ([place]) 10/05/2013  . MDD (major depressive disorder) 06/13/2012    Past Surgical History:  Procedure Laterality Date  . APPENDECTOMY    . CESAREAN SECTION     x2  . ECTOPIC PREGNANCY SURGERY    . pinched nerve rt arm.    . TUBAL LIGATION       OB History    Gravida  5   Para  2   Term  2   Preterm      AB  3   Living  2     SAB  2   IAB      Ectopic  1   Multiple      Live Births               Family History  Problem Relation Age of Onset  . Diabetes Mother     Social History   Tobacco Use  . Smoking status: Never Smoker  . Smokeless tobacco: Never Used  Substance Use Topics  . Alcohol use: No  . Drug use: No    Home Medications Prior to Admission medications   Medication Sig Start Date End Date Taking? Authorizing Provider  amphetamine-dextroamphetamine (ADDERALL) 10 MG tablet Take 10 mg by mouth 2 (two) times daily with a meal.    [provider]  buprenorphine (SUBUTEX) 8 MG SUBL SL tablet Place 8 mg under the tongue 3 (three) times daily. 07/17/19   [provider]  Glecaprevir-Pibrentasvir (MAVYRET) 100-40 MG TABS Take 3 tablets by mouth daily with breakfast. 07/09/19   Kuppelweiser, Cassie L, RPH-CPP  hydrOXYzine (ATARAX/VISTARIL) 25 MG tablet Take 1 tablet (25 mg total) by mouth every 6 (six) hours. 06/08/20   Wurst, [place], PA-C  ibuprofen (ADVIL,MOTRIN) 200 MG tablet Take 400 mg by mouth every  6 (six) hours as needed for moderate pain.    [provider]  lidocaine (XYLOCAINE) 2 % solution Use as directed 15 mLs in the mouth or throat as needed for mouth pain. 08/03/19   Avegno, Darrelyn Hillock, FNP  meloxicam (MOBIC) 7.5 MG tablet Take 7.5 mg by mouth daily.    [provider]  ondansetron (ZOFRAN) 4 MG tablet Take 1 tablet (4 mg total) by mouth every 8 (eight) hours as needed for nausea or vomiting. 08/03/19   Avegno, Darrelyn Hillock, FNP  predniSONE (STERAPRED UNI-PAK 21 TAB) 10 MG (21) TBPK tablet Take by mouth daily. Take 6 tabs by mouth daily  for 2 days, then 5 tabs for 2 days, then 4 tabs for 2 days, then 3 tabs for 2 days, 2 tabs for 2 days, then 1 tab by mouth daily for 2 days 06/08/20   Stacey Drain, [place], PA-C  sertraline (ZOLOFT) 100 MG tablet Take 1 tablet (100 mg total) by mouth daily. For major depressive disorder and anxiety. 10/14/13   Ruben Im, PA-C  furosemide (LASIX) 20 MG tablet Take 1 tablet (20 mg  total) by mouth daily. Patient not taking: Reported on 06/29/2019 09/20/17 06/08/20  Nat Christen, MD    Allergies    Amoxicillin, Naloxone, and Penicillins  Review of Systems   Review of Systems  Constitutional: Negative for chills, fatigue and fever.  HENT: Negative for congestion, sore throat and trouble swallowing.        "knot" to scalp  Eyes: Positive for pain and visual disturbance. Negative for itching.       Excessive tearing left eye  Respiratory: Negative for cough and shortness of breath.   Cardiovascular: Negative for chest pain and palpitations.  Gastrointestinal: Negative for nausea and vomiting.  Musculoskeletal: Negative for arthralgias, neck pain and neck stiffness.  Skin: Negative for rash.  Neurological: Negative for dizziness, weakness, numbness and headaches.  Hematological: Does not bruise/bleed easily.    Physical Exam Updated Vital Signs BP (!) 154/98   Pulse 96   Temp 98 F (36.7 C) (Oral)   Resp 14   Ht 5\' 1"  (1.549 m)   Wt 67.1 kg   LMP 08/26/2020   SpO2 100%   BMI 27.96 kg/m   Physical Exam Vitals and nursing note reviewed.  Constitutional:      Appearance: Normal appearance. She is not ill-appearing.  HENT:     Head: Atraumatic.     Comments: Normal-appearing scalp.  No skin changes or lesions.  No papules or lymphadenopathy.    Mouth/Throat:     Mouth: Mucous membranes are moist.  Eyes:     General: Lids are everted, no foreign bodies appreciated. Vision grossly intact. Gaze aligned appropriately.        Left eye: No foreign body or discharge.     Extraocular Movements: Extraocular movements intact.     Conjunctiva/sclera:     Left eye: Left conjunctiva is not injected. No chemosis.    Pupils: Pupils are equal, round, and reactive to light.     Left eye: Corneal abrasion and fluorescein uptake present. Seidel exam negative.    Slit lamp exam:    Left eye: No corneal ulcer, foreign body or hyphema.     Comments: Small corneal abrasion  at the 6 o'clock position.  No hyphema  Cardiovascular:     Rate and Rhythm: Normal rate and regular rhythm.     Pulses: Normal pulses.  Pulmonary:     Effort: Pulmonary  effort is normal.  Chest:     Chest wall: No tenderness.  Musculoskeletal:        General: Normal range of motion.     Cervical back: Normal range of motion. No rigidity or tenderness.  Lymphadenopathy:     Head:     Left side of head: No preauricular or posterior auricular adenopathy.     Cervical: No cervical adenopathy.     Left cervical: No superficial cervical adenopathy.  Skin:    General: Skin is warm.     Capillary Refill: Capillary refill takes less than 2 seconds.     Findings: No rash.  Neurological:     General: No focal deficit present.     Mental Status: She is alert.     Sensory: No sensory deficit.     Motor: No weakness.     ED Results / Procedures / Treatments   Labs (all labs ordered are listed, but only abnormal results are displayed) Labs Reviewed - No data to display  EKG None  Radiology No results found.  Procedures Procedures   Medications Ordered in ED Medications  tobramycin (TOBREX) 0.3 % ophthalmic solution 1 drop (has no administration in time range)  ketorolac (ACULAR) 0.5 % ophthalmic solution 1 drop (has no administration in time range)  fluorescein ophthalmic strip 1 strip (1 strip Left Eye Given by Other 09/10/20 1713)  tetracaine (PONTOCAINE) 0.5 % ophthalmic solution 2 drop (2 drops Left Eye Given by Other 09/10/20 1713)    ED Course  I have reviewed the triage vital signs and the nursing notes.  Pertinent labs & imaging results that were available during my care of the patient were reviewed by me and considered in my medical decision making (see chart for details).    MDM Rules/Calculators/A&P                          Pt here with possible FB/injury to the left eye.  Sx's present for 2 days.    On exam, no FB's seen has a corneal abrasion to left left.   Globe intact.  No evidence of penetrating trauma. She reports having a soreness and "knot" to left scalp.  No abnormalities seen or felt on exam.  Pt reassured.      Visual Acuity  Right Eye Distance: 20/30 Left Eye Distance: 20/50 Bilateral Distance: 20/25   Dispensed tobramycin and ketorolac drops. Appropriate for d/c home.   F/u info given for ophthalmology.    Final Clinical Impression(s) / ED Diagnoses Final diagnoses:  Abrasion of left cornea, initial encounter    Rx / DC Orders ED Discharge Orders    None       Kem Parkinson, PA-C 09/11/20 1451    Fredia Sorrow, MD 09/27/20 1625

## 2020-09-10 NOTE — ED Notes (Signed)
PA made aware of d/c vitals, ok for d/c, pt verbalized understanding d/c instructions and follow up, pt calling husband for a ride home.

## 2020-09-10 NOTE — Discharge Instructions (Addendum)
Apply 1 drop of the tobramycin every 4 hours to the left eye.  1 drop of the ketorolac to the left eye twice daily.  Cool compresses to your eye to help reduce swelling.  Call the ophthalmologist listed to arrange a follow-up appointment.  Avoid wearing your hair pulled tightly.

## 2020-09-10 NOTE — ED Triage Notes (Addendum)
Pt with possible FB to left eye x 2 days.  Believes she may have gotten some mulch in her left eye.   Knot to scalp on left for past several days causing her head to hurt.

## 2020-09-10 NOTE — ED Triage Notes (Signed)
Pt denies any N/V/D.  Tylenol taken at home for pain with mild relief.

## 2021-02-26 ENCOUNTER — Ambulatory Visit
Admission: EM | Admit: 2021-02-26 | Discharge: 2021-02-26 | Disposition: A | Discharge status: Home / Self Care | Payer: Medicaid Other | Attending: Family Medicine | Admitting: Family Medicine

## 2021-02-26 ENCOUNTER — Encounter: Payer: Self-pay | Admitting: Emergency Medicine

## 2021-02-26 DIAGNOSIS — K047 Periapical abscess without sinus: Secondary | ICD-10-CM | POA: Diagnosis not present

## 2021-02-26 DIAGNOSIS — S025XXA Fracture of tooth (traumatic), initial encounter for closed fracture: Secondary | ICD-10-CM

## 2021-02-26 MED ORDER — KETOROLAC TROMETHAMINE 10 MG PO TABS: 10.0000 mg | ORAL_TABLET | Freq: Two times a day (BID) | ORAL | 0 refills | Status: AC | PRN

## 2021-02-26 MED ORDER — CLINDAMYCIN HCL 300 MG PO CAPS: 300.0000 mg | ORAL_CAPSULE | Freq: Three times a day (TID) | ORAL | 0 refills | Status: AC

## 2021-02-26 NOTE — ED Triage Notes (Signed)
Tooth pain on left side x 1 year  has not had pain until now.  States she is on medicaid and unable to get a root canal.

## 2021-06-03 IMAGING — US US ABDOMEN LIMITED W/ ELASTOGRAPHY
2 series · 12 of 25 positions shown · non-contrast
Comparison: None.

CLINICAL DATA: Hepatitis C

EXAM:
US ABDOMEN LIMITED - RIGHT UPPER QUADRANT
ULTRASOUND HEPATIC ELASTOGRAPHY
TECHNIQUE: Sonography of the right upper quadrant was performed. In addition,
ultrasound elastography evaluation of the liver was performed. A
region of interest was placed within the right lobe of the liver.
Following application of a compressive sonographic pulse, tissue
compressibility was assessed. Multiple assessments were performed at
the selected site. Median tissue compressibility was determined.
Previously, hepatic stiffness was assessed by shear wave velocity.
Based on recently published Society of Radiologists in Ultrasound
consensus article, reporting is now recommended to be performed in
the SI units of pressure (kiloPascals) representing hepatic
stiffness/elasticity. The obtained result is compared to the
published reference standards. (cACLD= compensated Advanced Chronic
Liver Disease)

[Series 1: us abdomen limited w/ elastography · 7 of 62 slices shown (1 of 2)]
[im 5/62]
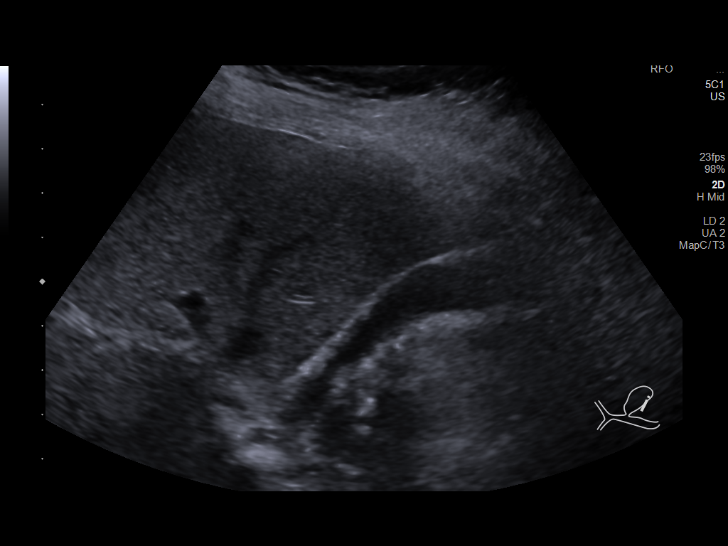
[im 14/62]
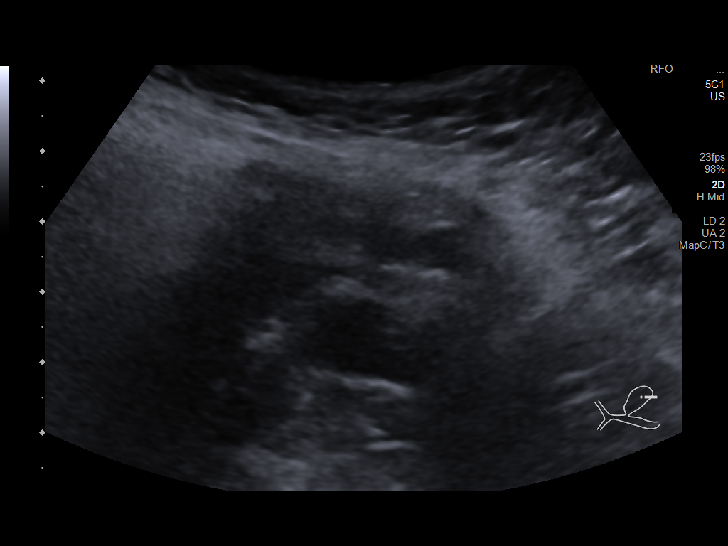
[im 22/62]
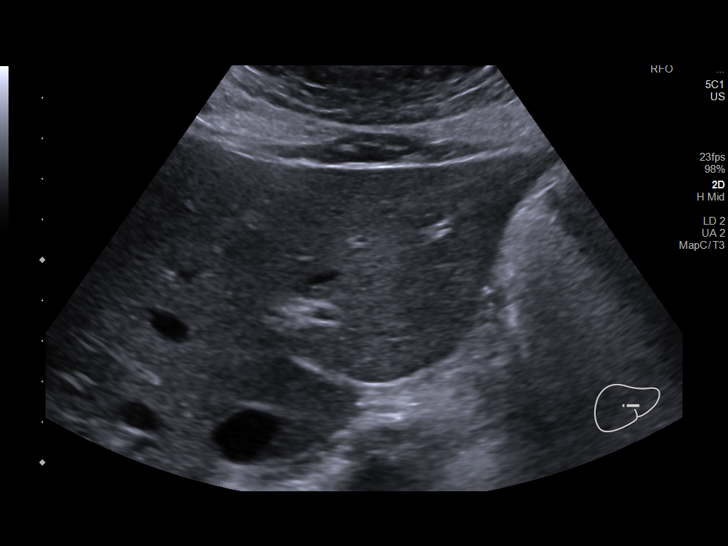
[im 31/62]
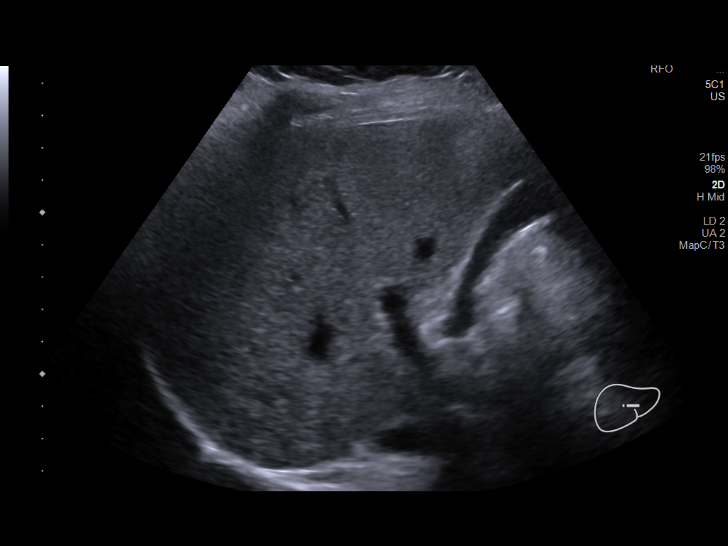
[im 40/62]
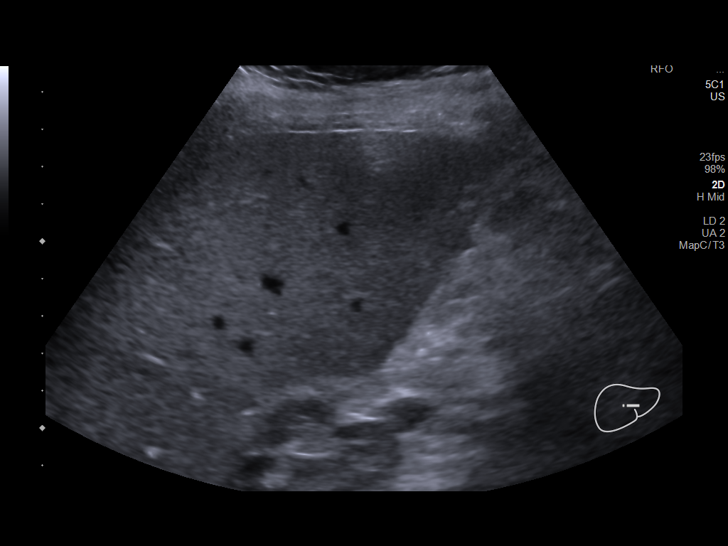
[im 48/62]
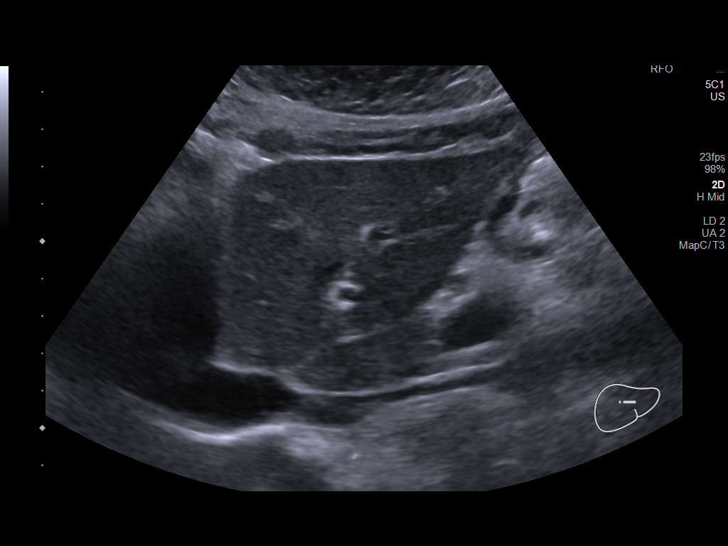
[im 57/62]
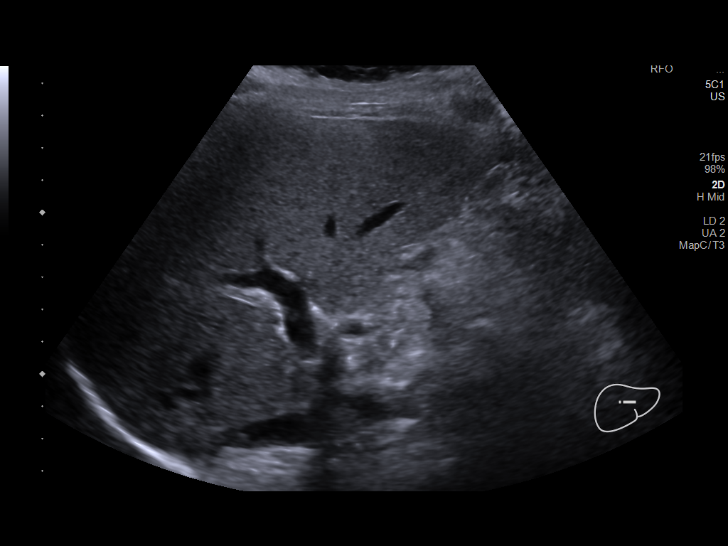

[Series 1001: us abdomen limited w/ elastography · 5 of 40 slices shown (2 of 2)]
[im 1/40]
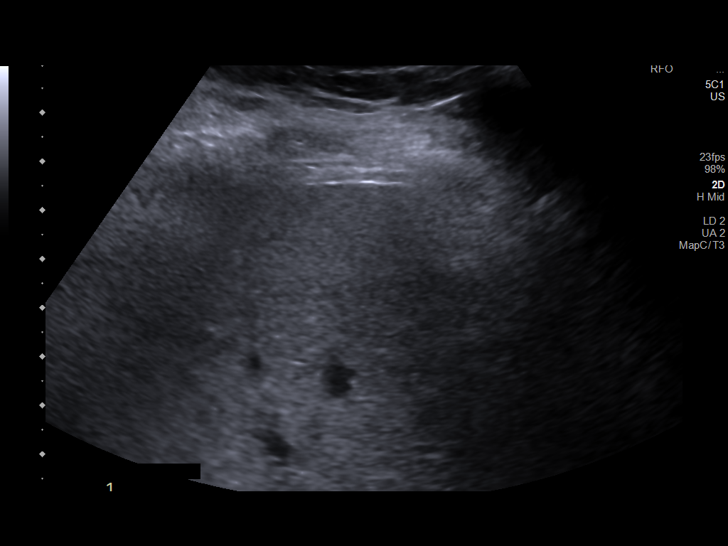
[im 9/40]
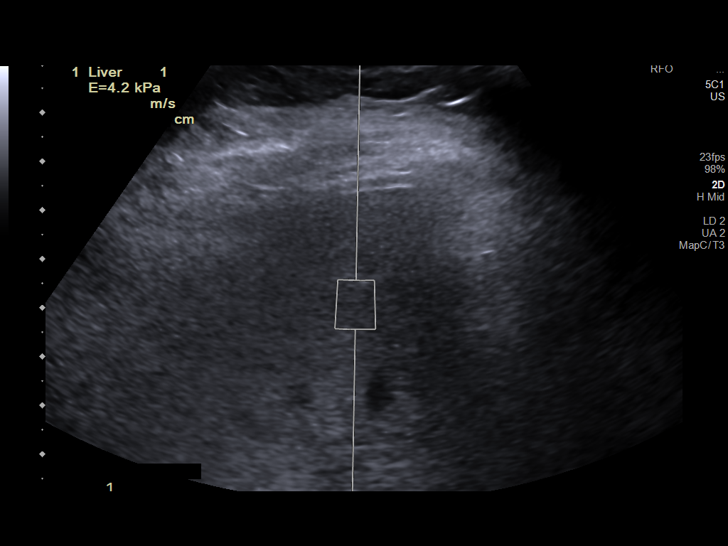
[im 18/40]
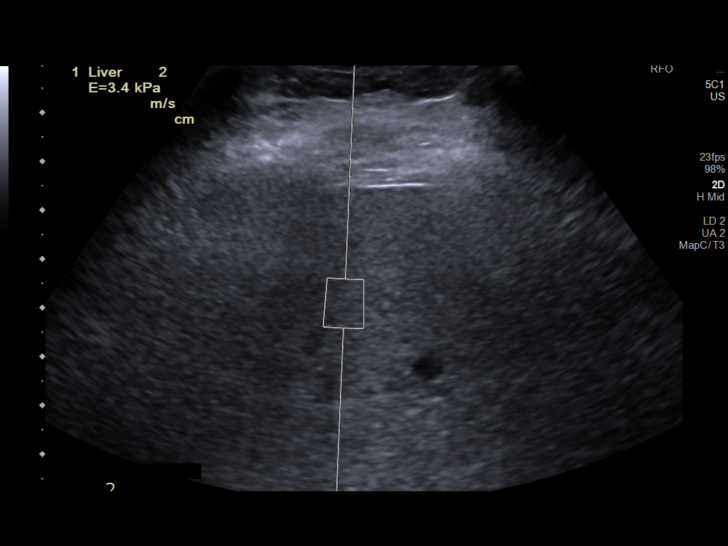
[im 27/40]
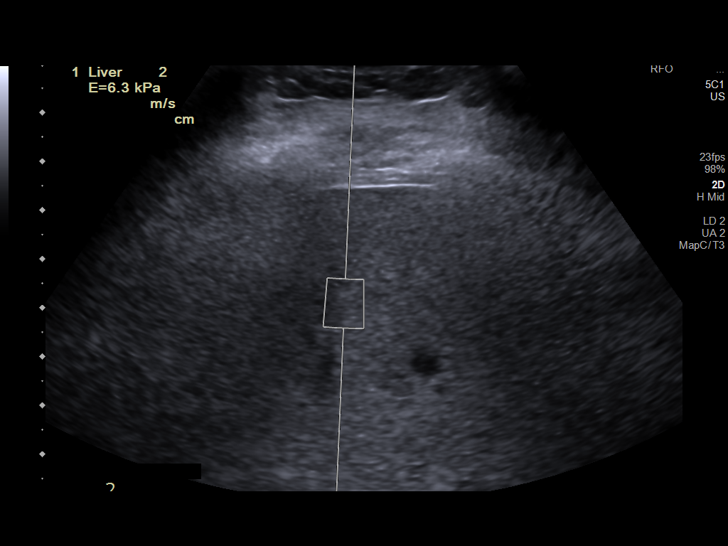
[im 35/40]
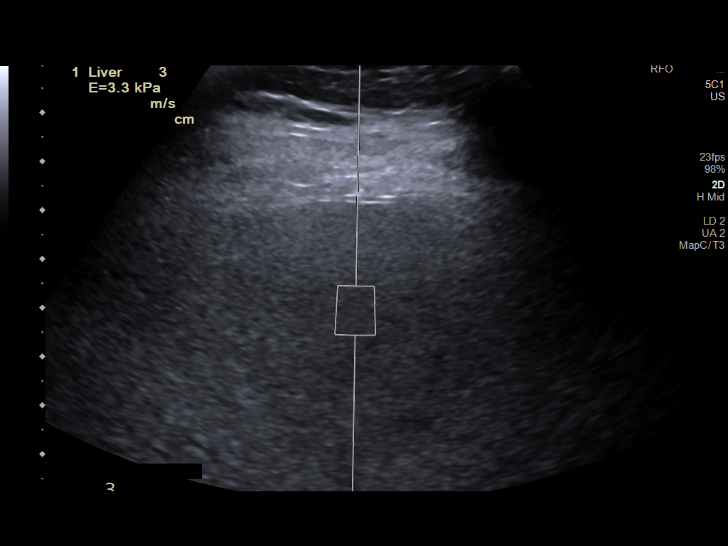

[12 of 25 positions shown; findings below may reference images not displayed]

FINDINGS: ULTRASOUND ABDOMEN LIMITED RIGHT UPPER QUADRANT

Gallbladder:

No gallstones, gallbladder wall thickening, or pericholecystic
fluid. Negative sonographic Murphy's sign.

Common bile duct:

Diameter: 2 mm

Liver:

At the upper limits of normal for parenchymal echogenicity. No focal
hepatic lesion is seen. Portal vein is patent on color Doppler
imaging with normal direction of blood flow towards the liver.

ULTRASOUND HEPATIC ELASTOGRAPHY

Device: Siemens Helix VTQ

Patient position: Oblique

Transducer 5C1

Number of measurements: 10

Hepatic segment:  8

Median kPa:

IQR:

IQR/Median kPa ratio:

Data quality:  Good

Diagnostic category:  ?5 kPa: high probability of being normal
IMPRESSION: ULTRASOUND RUQ:

Negative right upper quadrant ultrasound.

ULTRASOUND HEPATIC ELASTOGRAPHY:

Median kPa:

Diagnostic category:  ?5 kPa: high probability of being normal

The use of hepatic elastography is applicable to patients with viral
hepatitis and non-alcoholic fatty liver disease. At this time, there
is insufficient data for the referenced cut-off values and use in
other causes of liver disease, including alcoholic liver disease.
Patients, however, may be assessed by elastography and serve as
their own reference standard/baseline.

In patients with non-alcoholic liver disease, the values suggesting
compensated advanced chronic liver disease (cACLD) may be lower, and
patients may need additional testing with elasticity results of [DATE]
kPa.

Please note that abnormal hepatic elasticity and shear wave
velocities may also be identified in clinical settings other than
with hepatic fibrosis, such as: acute hepatitis, elevated right
heart and central venous pressures including use of beta blockers,
Nicola disease ([place]), infiltrative processes such as
mastocytosis/amyloidosis/infiltrative tumor/lymphoma, extrahepatic
cholestasis, with hyperemia in the post-prandial state, and with
liver transplantation. Correlation with patient history, laboratory
data, and clinical condition recommended.

## 2021-11-20 ENCOUNTER — Ambulatory Visit
Admission: RE | Admit: 2021-11-20 | Discharge: 2021-11-20 | Disposition: A | Discharge status: Home / Self Care | Payer: Medicaid Other | Source: Ambulatory Visit

## 2021-11-20 VITALS — BP 124/82 | HR 89 | Temp 97.9°F | Resp 18

## 2021-11-20 DIAGNOSIS — K047 Periapical abscess without sinus: Secondary | ICD-10-CM | POA: Diagnosis not present

## 2021-11-20 HISTORY — DX: Essential (primary) hypertension: I10

## 2021-11-20 MED ORDER — CLINDAMYCIN HCL 300 MG PO CAPS
300.0000 mg | ORAL_CAPSULE | Freq: Two times a day (BID) | ORAL | 0 refills | Status: AC
Start: 2021-11-20 — End: ?

## 2021-11-20 MED ORDER — CHLORHEXIDINE GLUCONATE 0.12 % MT SOLN: 15.0000 mL | Freq: Two times a day (BID) | OROMUCOSAL | 0 refills | Status: AC

## 2021-11-20 MED ORDER — LIDOCAINE VISCOUS HCL 2 % MT SOLN: 10.0000 mL | OROMUCOSAL | 0 refills | Status: AC | PRN

## 2021-11-20 NOTE — ED Triage Notes (Signed)
States she is having tooth pain on upper left side and broken tooth on right side.  States she is unable to get into a dentist office.  Has been taking ibuprofen to help with pain ?

## 2021-11-20 NOTE — ED Provider Notes (Signed)
?[facility] ? ? ? ?CSN: [id number] ?Arrival date & time: 11/20/21  0806 ? ? ?  ? ?History   ?Chief Complaint ?Chief Complaint  ?Patient presents with  ? Facial Pain  ?  Entered by patient  ? ?HPI ?[name] is a 41 y.o. female.  ? ?Patient presenting today with bilateral upper dental pain, swelling.  Knows that she needs to root canals to the upper left and has a broken molar in the back upper right.  She denies fever, chills, dysphagia, trouble breathing, drainage or bleeding into the mouth.  Trying ibuprofen with no relief.  Unable to get in with a dentist anytime soon. ? ?Past Medical History:  ?Diagnosis Date  ? Anxiety   ? Bipolar 1 disorder ([place])   ? Depression   ? Fibromyalgia   ? Hypertension   ? Pinworms   ? Pituitary tumor   ? ? ?Patient Active Problem List  ? Diagnosis Date Noted  ? Chronic hepatitis C without hepatic coma ([place]) 06/29/2019  ? Polysubstance (including opioids) dependence with physiological dependence ([place]) 10/05/2013  ? Substance induced mood disorder ([place]) 10/05/2013  ? MDD (major depressive disorder) 06/13/2012  ? ? ?Past Surgical History:  ?Procedure Laterality Date  ? APPENDECTOMY    ? CESAREAN SECTION    ? x2  ? ECTOPIC PREGNANCY SURGERY    ? pinched nerve rt arm.    ? TUBAL LIGATION    ? ? ?OB History   ? ? Gravida  ?5  ? Para  ?2  ? Term  ?2  ? Preterm  ?   ? AB  ?3  ? Living  ?2  ?  ? ? SAB  ?2  ? IAB  ?   ? Ectopic  ?1  ? Multiple  ?   ? Live Births  ?   ?   ?  ?  ? ? ? ?Home Medications   ? ?Prior to Admission medications   ?Medication Sig Start Date End Date Taking? Authorizing Provider  ?chlorhexidine (PERIDEX) 0.12 % solution Use as directed 15 mLs in the mouth or throat 2 (two) times daily. 11/20/21  Yes Volney American, PA-C  ?clindamycin (CLEOCIN) 300 MG capsule Take 1 capsule (300 mg total) by mouth 2 (two) times daily. 11/20/21  Yes Volney American, PA-C  ?lidocaine (XYLOCAINE) 2 % solution Use as directed 10 mLs in the mouth or throat every 3  (three) hours as needed for mouth pain. 11/20/21  Yes Volney American, PA-C  ?lisinopril (ZESTRIL) 5 MG tablet Take 5 mg by mouth daily.   Yes [provider]  ?amphetamine-dextroamphetamine (ADDERALL) 10 MG tablet Take 10 mg by mouth 2 (two) times daily with a meal.    [provider]  ?buprenorphine (SUBUTEX) 8 MG SUBL SL tablet Place 8 mg under the tongue 3 (three) times daily. 07/17/19   [provider]  ?Glecaprevir-Pibrentasvir (MAVYRET) 100-40 MG TABS Take 3 tablets by mouth daily with breakfast. 07/09/19   Kuppelweiser, Cassie L, RPH-CPP  ?hydrOXYzine (ATARAX/VISTARIL) 25 MG tablet Take 1 tablet (25 mg total) by mouth every 6 (six) hours. 06/08/20   Wurst, [place], PA-C  ?ibuprofen (ADVIL,MOTRIN) 200 MG tablet Take 400 mg by mouth every 6 (six) hours as needed for moderate pain.    [provider]  ?ketorolac (TORADOL) 10 MG tablet Take 1 tablet (10 mg total) by mouth 2 (two) times daily as needed. 02/26/21   Scot Jun, FNP  ?lidocaine (XYLOCAINE)  2 % solution Use as directed 15 mLs in the mouth or throat as needed for mouth pain. 08/03/19   Emerson Monte, FNP  ?meloxicam (MOBIC) 7.5 MG tablet Take 7.5 mg by mouth daily.    [provider]  ?ondansetron (ZOFRAN) 4 MG tablet Take 1 tablet (4 mg total) by mouth every 8 (eight) hours as needed for nausea or vomiting. 08/03/19   Avegno, Darrelyn Hillock, FNP  ?predniSONE (STERAPRED UNI-PAK 21 TAB) 10 MG (21) TBPK tablet Take by mouth daily. Take 6 tabs by mouth daily  for 2 days, then 5 tabs for 2 days, then 4 tabs for 2 days, then 3 tabs for 2 days, 2 tabs for 2 days, then 1 tab by mouth daily for 2 days 06/08/20   Stacey Drain, [place], PA-C  ?sertraline (ZOLOFT) 100 MG tablet Take 1 tablet (100 mg total) by mouth daily. For major depressive disorder and anxiety. 10/14/13   Ruben Im, PA-C  ?furosemide (LASIX) 20 MG tablet Take 1 tablet (20 mg total) by mouth daily. ?Patient not taking: Reported on  06/29/2019 09/20/17 06/08/20  Nat Christen, MD  ? ? ?Family History ?Family History  ?Problem Relation Age of Onset  ? Diabetes Mother   ? ? ?Social History ?Social History  ? ?Tobacco Use  ? Smoking status: Never  ? Smokeless tobacco: Never  ?Substance Use Topics  ? Alcohol use: No  ? Drug use: No  ? ? ? ?Allergies   ?Amoxicillin, Naloxone, and Penicillins ? ? ?Review of Systems ?Review of Systems ?Per HPI ? ?Physical Exam ?Triage Vital Signs ?ED Triage Vitals [11/20/21 0819]  ?Enc Vitals Group  ?   BP 124/82  ?   Pulse Rate 89  ?   Resp 18  ?   Temp 97.9 ?F (36.6 ?C)  ?   Temp Source Oral  ?   SpO2 99 %  ?   Weight   ?   Height   ?   Head Circumference   ?   Peak Flow   ?   Pain Score 9  ?   Pain Loc   ?   Pain Edu?   ?   Excl. in [place]?   ? ?No data found. ? ?Updated Vital Signs ?BP 124/82 (BP Location: Right Arm)   Pulse 89   Temp 97.9 ?F (36.6 ?C) (Oral)   Resp 18   LMP 11/07/2021 (Approximate)   SpO2 99%  ? ?Visual Acuity ?Right Eye Distance:   ?Left Eye Distance:   ?Bilateral Distance:   ? ?Right Eye Near:   ?Left Eye Near:    ?Bilateral Near:    ? ?Physical Exam ?Vitals and nursing note reviewed.  ?Constitutional:   ?   Appearance: Normal appearance. She is not ill-appearing.  ?HENT:  ?   Head: Atraumatic.  ?   Mouth/Throat:  ?   Comments: Broken molar posterior right upper dental region with gingival erythema, edema, no active drainage or bleeding.  Gingival erythema, edema left upper dental region ?Eyes:  ?   Extraocular Movements: Extraocular movements intact.  ?   Conjunctiva/sclera: Conjunctivae normal.  ?Cardiovascular:  ?   Rate and Rhythm: Normal rate and regular rhythm.  ?   Heart sounds: Normal heart sounds.  ?Pulmonary:  ?   Effort: Pulmonary effort is normal.  ?   Breath sounds: Normal breath sounds.  ?Musculoskeletal:     ?   General: Normal range of motion.  ?   Cervical back: Normal range  of motion and neck supple.  ?Skin: ?   General: Skin is warm and dry.  ?Neurological:  ?   Mental Status:  She is alert and oriented to person, place, and time.  ?Psychiatric:     ?   Mood and Affect: Mood normal.     ?   Thought Content: Thought content normal.     ?   Judgment: Judgment normal.  ? ? ? ?UC Treatments / Results  ?Labs ?(all labs ordered are listed, but only abnormal results are displayed) ?Labs Reviewed - No data to display ? ?EKG ? ? ?Radiology ?No results found. ? ?Procedures ?Procedures (including critical care time) ? ?Medications Ordered in UC ?Medications - No data to display ? ?Initial Impression / Assessment and Plan / UC Course  ?I have reviewed the triage vital signs and the nursing notes. ? ?Pertinent labs & imaging results that were available during my care of the patient were reviewed by me and considered in my medical decision making (see chart for details). ? ?  ? ?Treat with clindamycin, viscous lidocaine, Peridex rinse.  Follow-up with dentist as soon as possible.  Over-the-counter pain relievers as needed. ? ?Final Clinical Impressions(s) / UC Diagnoses  ? ?Final diagnoses:  ?Dental infection  ? ?Discharge Instructions   ?None ?  ? ?ED Prescriptions   ? ? Medication Sig Dispense Auth. Provider  ? clindamycin (CLEOCIN) 300 MG capsule Take 1 capsule (300 mg total) by mouth 2 (two) times daily. 14 capsule Volney American, [place]  ? lidocaine (XYLOCAINE) 2 % solution Use as directed 10 mLs in the mouth or throat every 3 (three) hours as needed for mouth pain. 100 mL Volney American, PA-C  ? chlorhexidine (PERIDEX) 0.12 % solution Use as directed 15 mLs in the mouth or throat 2 (two) times daily. 120 mL Volney American, [place]  ? ?  ? ?PDMP not reviewed this encounter. ?  ?Volney American, PA-C ?11/20/21 1557 ? ?

## 2022-04-19 ENCOUNTER — Other Ambulatory Visit: Payer: Self-pay | Admitting: Family Medicine

## 2022-04-22 NOTE — Telephone Encounter (Signed)
Not a pt in this practice, prescriber not in this practice.

## 2022-05-10 ENCOUNTER — Other Ambulatory Visit: Payer: Self-pay | Admitting: Family Medicine

## 2022-05-29 ENCOUNTER — Other Ambulatory Visit: Payer: Self-pay | Admitting: Family Medicine

## 2022-07-04 ENCOUNTER — Other Ambulatory Visit: Payer: Self-pay | Admitting: Family Medicine

## 2022-07-05 NOTE — Telephone Encounter (Signed)
Requested Prescriptions  Pending Prescriptions Disp Refills   chlorhexidine (PERIDEX) 0.12 % solution [Pharmacy Med Name: CHLORHEXIDINE 0.12% RINSE] 473 mL     Sig: USE AS DIRECTED 15 ML IN THE MOUTH OR THROAT TWICE A DAY (SPIT OUT / DO NOT SWALLOW)     There is no refill protocol information for this order

## 2022-07-28 ENCOUNTER — Other Ambulatory Visit: Payer: Self-pay | Admitting: Family Medicine

## 2022-07-29 NOTE — Telephone Encounter (Signed)
Unable to refill per protocol, last refill by another provider.  Provider and patient not at this practice, will refuse.  Requested Prescriptions  Pending Prescriptions Disp Refills   chlorhexidine (PERIDEX) 0.12 % solution [Pharmacy Med Name: CHLORHEXIDINE 0.12% RINSE] 473 mL     Sig: USE AS DIRECTED 15 ML IN THE MOUTH OR THROAT TWICE A DAY (SPIT OUT / DO NOT SWALLOW)     There is no refill protocol information for this order

## 2022-08-28 ENCOUNTER — Other Ambulatory Visit: Payer: Self-pay | Admitting: Family Medicine

## 2022-10-01 ENCOUNTER — Other Ambulatory Visit: Payer: Self-pay | Admitting: Family Medicine

## 2022-10-02 NOTE — Telephone Encounter (Signed)
Unable to refill per protocol, last refill by another provider.  Not at this practice.  Requested Prescriptions  Pending Prescriptions Disp Refills   chlorhexidine (PERIDEX) 0.12 % solution [Pharmacy Med Name: CHLORHEXIDINE 0.12% RINSE] 473 mL     Sig: USE AS DIRECTED 15 ML IN THE MOUTH OR THROAT TWICE A DAY (SPIT OUT / DO NOT SWALLOW)     There is no refill protocol information for this order

## 2022-10-26 ENCOUNTER — Other Ambulatory Visit: Payer: Self-pay | Admitting: Family Medicine

## 2022-10-28 NOTE — Telephone Encounter (Signed)
No longer under prescriber care.  Requested Prescriptions  Pending Prescriptions Disp Refills   chlorhexidine (PERIDEX) 0.12 % solution [Pharmacy Med Name: CHLORHEXIDINE 0.12% RINSE] 473 mL     Sig: USE AS DIRECTED 15 ML IN THE MOUTH OR THROAT TWICE A DAY (SPIT OUT / DO NOT SWALLOW)     There is no refill protocol information for this order

## 2022-12-25 DIAGNOSIS — F411 Generalized anxiety disorder: Secondary | ICD-10-CM | POA: Diagnosis not present

## 2022-12-25 DIAGNOSIS — Z79899 Other long term (current) drug therapy: Secondary | ICD-10-CM | POA: Diagnosis not present

## 2022-12-25 DIAGNOSIS — Z1339 Encounter for screening examination for other mental health and behavioral disorders: Secondary | ICD-10-CM | POA: Diagnosis not present

## 2022-12-25 DIAGNOSIS — F3130 Bipolar disorder, current episode depressed, mild or moderate severity, unspecified: Secondary | ICD-10-CM | POA: Diagnosis not present

## 2022-12-27 DIAGNOSIS — Z79899 Other long term (current) drug therapy: Secondary | ICD-10-CM | POA: Diagnosis not present

## 2023-01-21 DIAGNOSIS — Z79899 Other long term (current) drug therapy: Secondary | ICD-10-CM | POA: Diagnosis not present

## 2023-01-21 DIAGNOSIS — Z5181 Encounter for therapeutic drug level monitoring: Secondary | ICD-10-CM | POA: Diagnosis not present

## 2023-01-21 DIAGNOSIS — F1124 Opioid dependence with opioid-induced mood disorder: Secondary | ICD-10-CM | POA: Diagnosis not present

## 2023-01-21 DIAGNOSIS — F32A Depression, unspecified: Secondary | ICD-10-CM | POA: Diagnosis not present

## 2023-01-21 DIAGNOSIS — F900 Attention-deficit hyperactivity disorder, predominantly inattentive type: Secondary | ICD-10-CM | POA: Diagnosis not present

## 2023-01-21 DIAGNOSIS — F1120 Opioid dependence, uncomplicated: Secondary | ICD-10-CM | POA: Diagnosis not present

## 2023-02-24 DIAGNOSIS — F32A Depression, unspecified: Secondary | ICD-10-CM | POA: Diagnosis not present

## 2023-02-24 DIAGNOSIS — F900 Attention-deficit hyperactivity disorder, predominantly inattentive type: Secondary | ICD-10-CM | POA: Diagnosis not present

## 2023-02-24 DIAGNOSIS — F1120 Opioid dependence, uncomplicated: Secondary | ICD-10-CM | POA: Diagnosis not present

## 2023-02-24 DIAGNOSIS — Z5181 Encounter for therapeutic drug level monitoring: Secondary | ICD-10-CM | POA: Diagnosis not present

## 2023-02-24 DIAGNOSIS — Z79899 Other long term (current) drug therapy: Secondary | ICD-10-CM | POA: Diagnosis not present

## 2023-02-24 DIAGNOSIS — F1124 Opioid dependence with opioid-induced mood disorder: Secondary | ICD-10-CM | POA: Diagnosis not present

## 2023-03-12 ENCOUNTER — Other Ambulatory Visit (HOSPITAL_COMMUNITY): Payer: Self-pay | Admitting: Adult Health Nurse Practitioner

## 2023-03-12 ENCOUNTER — Ambulatory Visit (HOSPITAL_COMMUNITY)
Admission: RE | Admit: 2023-03-12 | Discharge: 2023-03-12 | Disposition: A | Discharge status: Home / Self Care | Payer: 59 | Source: Ambulatory Visit | Attending: Adult Health Nurse Practitioner | Admitting: Adult Health Nurse Practitioner

## 2023-03-12 DIAGNOSIS — I10 Essential (primary) hypertension: Secondary | ICD-10-CM | POA: Diagnosis not present

## 2023-03-12 DIAGNOSIS — Z136 Encounter for screening for cardiovascular disorders: Secondary | ICD-10-CM | POA: Diagnosis not present

## 2023-03-12 DIAGNOSIS — Z1231 Encounter for screening mammogram for malignant neoplasm of breast: Secondary | ICD-10-CM | POA: Diagnosis not present

## 2023-03-12 DIAGNOSIS — M05741 Rheumatoid arthritis with rheumatoid factor of right hand without organ or systems involvement: Secondary | ICD-10-CM | POA: Diagnosis not present

## 2023-03-12 DIAGNOSIS — R7303 Prediabetes: Secondary | ICD-10-CM | POA: Diagnosis not present

## 2023-03-12 DIAGNOSIS — Z124 Encounter for screening for malignant neoplasm of cervix: Secondary | ICD-10-CM | POA: Diagnosis not present

## 2023-03-12 DIAGNOSIS — N920 Excessive and frequent menstruation with regular cycle: Secondary | ICD-10-CM | POA: Diagnosis not present

## 2023-03-12 DIAGNOSIS — M25531 Pain in right wrist: Secondary | ICD-10-CM | POA: Insufficient documentation

## 2023-03-12 DIAGNOSIS — F322 Major depressive disorder, single episode, severe without psychotic features: Secondary | ICD-10-CM | POA: Diagnosis not present

## 2023-03-12 DIAGNOSIS — M05742 Rheumatoid arthritis with rheumatoid factor of left hand without organ or systems involvement: Secondary | ICD-10-CM | POA: Diagnosis not present

## 2023-03-12 DIAGNOSIS — Z0000 Encounter for general adult medical examination without abnormal findings: Secondary | ICD-10-CM | POA: Diagnosis not present

## 2023-03-12 DIAGNOSIS — Z13220 Encounter for screening for lipoid disorders: Secondary | ICD-10-CM | POA: Diagnosis not present

## 2023-03-17 ENCOUNTER — Other Ambulatory Visit (HOSPITAL_COMMUNITY): Payer: Self-pay | Admitting: Adult Health Nurse Practitioner

## 2023-03-17 DIAGNOSIS — Z1231 Encounter for screening mammogram for malignant neoplasm of breast: Secondary | ICD-10-CM

## 2023-03-19 ENCOUNTER — Ambulatory Visit (HOSPITAL_COMMUNITY): Payer: 59

## 2023-03-25 DIAGNOSIS — Z5181 Encounter for therapeutic drug level monitoring: Secondary | ICD-10-CM | POA: Diagnosis not present

## 2023-03-25 DIAGNOSIS — F1124 Opioid dependence with opioid-induced mood disorder: Secondary | ICD-10-CM | POA: Diagnosis not present

## 2023-03-25 DIAGNOSIS — Z79899 Other long term (current) drug therapy: Secondary | ICD-10-CM | POA: Diagnosis not present

## 2023-03-25 DIAGNOSIS — F32A Depression, unspecified: Secondary | ICD-10-CM | POA: Diagnosis not present

## 2023-03-25 DIAGNOSIS — F900 Attention-deficit hyperactivity disorder, predominantly inattentive type: Secondary | ICD-10-CM | POA: Diagnosis not present

## 2023-03-25 DIAGNOSIS — F1120 Opioid dependence, uncomplicated: Secondary | ICD-10-CM | POA: Diagnosis not present

## 2023-03-27 ENCOUNTER — Inpatient Hospital Stay (HOSPITAL_COMMUNITY): Admission: RE | Admit: 2023-03-27 | Payer: 59 | Source: Ambulatory Visit

## 2023-03-27 DIAGNOSIS — Z1339 Encounter for screening examination for other mental health and behavioral disorders: Secondary | ICD-10-CM | POA: Diagnosis not present

## 2023-03-27 DIAGNOSIS — F411 Generalized anxiety disorder: Secondary | ICD-10-CM | POA: Diagnosis not present

## 2023-03-27 DIAGNOSIS — Z79899 Other long term (current) drug therapy: Secondary | ICD-10-CM | POA: Diagnosis not present

## 2023-03-27 DIAGNOSIS — R030 Elevated blood-pressure reading, without diagnosis of hypertension: Secondary | ICD-10-CM | POA: Diagnosis not present

## 2023-03-27 DIAGNOSIS — F3130 Bipolar disorder, current episode depressed, mild or moderate severity, unspecified: Secondary | ICD-10-CM | POA: Diagnosis not present

## 2023-03-27 DIAGNOSIS — Z6826 Body mass index (BMI) 26.0-26.9, adult: Secondary | ICD-10-CM | POA: Diagnosis not present

## 2023-03-31 DIAGNOSIS — Z79899 Other long term (current) drug therapy: Secondary | ICD-10-CM | POA: Diagnosis not present

## 2023-04-02 DIAGNOSIS — F411 Generalized anxiety disorder: Secondary | ICD-10-CM | POA: Diagnosis not present

## 2023-04-02 DIAGNOSIS — Z79899 Other long term (current) drug therapy: Secondary | ICD-10-CM | POA: Diagnosis not present

## 2023-04-02 DIAGNOSIS — E039 Hypothyroidism, unspecified: Secondary | ICD-10-CM | POA: Diagnosis not present

## 2023-04-02 DIAGNOSIS — E785 Hyperlipidemia, unspecified: Secondary | ICD-10-CM | POA: Diagnosis not present

## 2023-04-22 DIAGNOSIS — F32A Depression, unspecified: Secondary | ICD-10-CM | POA: Diagnosis not present

## 2023-04-22 DIAGNOSIS — Z79899 Other long term (current) drug therapy: Secondary | ICD-10-CM | POA: Diagnosis not present

## 2023-04-22 DIAGNOSIS — Z5181 Encounter for therapeutic drug level monitoring: Secondary | ICD-10-CM | POA: Diagnosis not present

## 2023-04-22 DIAGNOSIS — F1120 Opioid dependence, uncomplicated: Secondary | ICD-10-CM | POA: Diagnosis not present

## 2023-04-22 DIAGNOSIS — F1124 Opioid dependence with opioid-induced mood disorder: Secondary | ICD-10-CM | POA: Diagnosis not present

## 2023-04-22 DIAGNOSIS — F900 Attention-deficit hyperactivity disorder, predominantly inattentive type: Secondary | ICD-10-CM | POA: Diagnosis not present

## 2023-05-20 DIAGNOSIS — F32A Depression, unspecified: Secondary | ICD-10-CM | POA: Diagnosis not present

## 2023-05-20 DIAGNOSIS — F1124 Opioid dependence with opioid-induced mood disorder: Secondary | ICD-10-CM | POA: Diagnosis not present

## 2023-05-20 DIAGNOSIS — F900 Attention-deficit hyperactivity disorder, predominantly inattentive type: Secondary | ICD-10-CM | POA: Diagnosis not present

## 2023-05-20 DIAGNOSIS — Z5181 Encounter for therapeutic drug level monitoring: Secondary | ICD-10-CM | POA: Diagnosis not present

## 2023-05-20 DIAGNOSIS — F1120 Opioid dependence, uncomplicated: Secondary | ICD-10-CM | POA: Diagnosis not present

## 2023-05-20 DIAGNOSIS — Z79899 Other long term (current) drug therapy: Secondary | ICD-10-CM | POA: Diagnosis not present

## 2023-07-03 DIAGNOSIS — F900 Attention-deficit hyperactivity disorder, predominantly inattentive type: Secondary | ICD-10-CM | POA: Diagnosis not present

## 2023-07-03 DIAGNOSIS — F1124 Opioid dependence with opioid-induced mood disorder: Secondary | ICD-10-CM | POA: Diagnosis not present

## 2023-07-03 DIAGNOSIS — Z79899 Other long term (current) drug therapy: Secondary | ICD-10-CM | POA: Diagnosis not present

## 2023-07-03 DIAGNOSIS — Z5181 Encounter for therapeutic drug level monitoring: Secondary | ICD-10-CM | POA: Diagnosis not present

## 2023-07-03 DIAGNOSIS — F32A Depression, unspecified: Secondary | ICD-10-CM | POA: Diagnosis not present

## 2023-07-03 DIAGNOSIS — F1120 Opioid dependence, uncomplicated: Secondary | ICD-10-CM | POA: Diagnosis not present

## 2023-07-31 DIAGNOSIS — F900 Attention-deficit hyperactivity disorder, predominantly inattentive type: Secondary | ICD-10-CM | POA: Diagnosis not present

## 2023-07-31 DIAGNOSIS — F32A Depression, unspecified: Secondary | ICD-10-CM | POA: Diagnosis not present

## 2023-07-31 DIAGNOSIS — Z79899 Other long term (current) drug therapy: Secondary | ICD-10-CM | POA: Diagnosis not present

## 2023-07-31 DIAGNOSIS — F1124 Opioid dependence with opioid-induced mood disorder: Secondary | ICD-10-CM | POA: Diagnosis not present

## 2023-07-31 DIAGNOSIS — F1120 Opioid dependence, uncomplicated: Secondary | ICD-10-CM | POA: Diagnosis not present

## 2023-07-31 DIAGNOSIS — Z5181 Encounter for therapeutic drug level monitoring: Secondary | ICD-10-CM | POA: Diagnosis not present

## 2023-08-05 DIAGNOSIS — Z79899 Other long term (current) drug therapy: Secondary | ICD-10-CM | POA: Diagnosis not present

## 2023-08-05 DIAGNOSIS — F1124 Opioid dependence with opioid-induced mood disorder: Secondary | ICD-10-CM | POA: Diagnosis not present

## 2023-08-05 DIAGNOSIS — E559 Vitamin D deficiency, unspecified: Secondary | ICD-10-CM | POA: Diagnosis not present

## 2023-08-05 DIAGNOSIS — Z131 Encounter for screening for diabetes mellitus: Secondary | ICD-10-CM | POA: Diagnosis not present

## 2023-08-05 DIAGNOSIS — F32A Depression, unspecified: Secondary | ICD-10-CM | POA: Diagnosis not present

## 2023-08-05 DIAGNOSIS — E039 Hypothyroidism, unspecified: Secondary | ICD-10-CM | POA: Diagnosis not present

## 2023-08-05 DIAGNOSIS — F900 Attention-deficit hyperactivity disorder, predominantly inattentive type: Secondary | ICD-10-CM | POA: Diagnosis not present

## 2024-02-25 ENCOUNTER — Other Ambulatory Visit (HOSPITAL_COMMUNITY): Payer: Self-pay | Admitting: Adult Health Nurse Practitioner

## 2024-02-25 DIAGNOSIS — Z1231 Encounter for screening mammogram for malignant neoplasm of breast: Secondary | ICD-10-CM

## 2024-03-01 ENCOUNTER — Encounter (HOSPITAL_COMMUNITY)

## 2024-03-01 DIAGNOSIS — Z1231 Encounter for screening mammogram for malignant neoplasm of breast: Secondary | ICD-10-CM

## 2024-06-29 ENCOUNTER — Other Ambulatory Visit (HOSPITAL_COMMUNITY): Payer: Self-pay | Admitting: Adult Health Nurse Practitioner

## 2024-06-29 DIAGNOSIS — Z1231 Encounter for screening mammogram for malignant neoplasm of breast: Secondary | ICD-10-CM

## 2024-07-27 ENCOUNTER — Telehealth: Admitting: Family Medicine

## 2024-07-27 NOTE — Progress Notes (Signed)
 The patient no-showed for appointment despite this provider sending direct link, reaching out via phone with no response and waiting for at least 10 minutes from appointment time for patient to join. They will be marked as a NS for this appointment/time.   Freddy Finner, NP

## 2024-07-30 ENCOUNTER — Ambulatory Visit: Payer: Self-pay

## 2024-07-30 NOTE — Telephone Encounter (Signed)
 FYI Only or Action Required?: FYI only for provider: [facility] advised.  Patient was last seen in primary care on Unknown.  Called Nurse Triage reporting Medication Refill.  Symptoms began several days ago.  Interventions attempted: OTC medications: Lisinopril.  Symptoms are: stable.  Triage Disposition: Information or Advice Only Call  Patient/caregiver understands and will follow disposition?: Yes  Reason for Disposition  Health information question, no triage required and triager able to answer question  Answer Assessment - Initial Assessment Questions 1. REASON FOR CALL: What is the main reason for your call? or How can I best help you?    Patient is calling because she needs 20mg  Lisinopril refill and is unable to get prescription from previous PCP.   2. SYMPTOMS : Do you have any symptoms?      Denies any current symptoms  3. OTHER QUESTIONS: Do you have any other questions?     No, [facility] advised for medication refill  Protocols used: Information Only Call - No Triage-A-AH  Copied from CRM [id number]. Topic: Clinical - Medication Question >> Jul 30, 2024  4:56 PM Victoria B wrote: Reason for CRM: patient has new patient appt 03/06 but needs her BP med, lisinopril (ZESTRIL) 5 MG tablet. She is unable to get it from her previus Dr. Pharmacy is [facility] [phone] - [place], [place] - [address] AT [facility]  Phone: [phone] Fax: [phone]

## 2024-09-24 ENCOUNTER — Ambulatory Visit
